# Patient Record
Sex: Female | Born: 1935 | Race: White | Hispanic: No | Smoking: Never smoker
Health system: Southern US, Community
[De-identification: ages and names within clinical notes are randomized; demographics above are authoritative.]

## PROBLEM LIST (undated history)

## (undated) DIAGNOSIS — Z950 Presence of cardiac pacemaker: Secondary | ICD-10-CM

## (undated) DIAGNOSIS — E7800 Pure hypercholesterolemia, unspecified: Secondary | ICD-10-CM

## (undated) DIAGNOSIS — I10 Essential (primary) hypertension: Secondary | ICD-10-CM

---

## 2013-11-25 ENCOUNTER — Encounter (HOSPITAL_COMMUNITY): Payer: Self-pay | Admitting: Emergency Medicine

## 2013-11-25 ENCOUNTER — Observation Stay (HOSPITAL_COMMUNITY): Payer: Medicare Other

## 2013-11-25 ENCOUNTER — Emergency Department (HOSPITAL_COMMUNITY): Payer: Medicare Other

## 2013-11-25 ENCOUNTER — Observation Stay (HOSPITAL_COMMUNITY)
Admission: EM | Admit: 2013-11-25 | Discharge: 2013-11-26 | Disposition: A | Discharge status: Home / Self Care | Payer: Medicare Other | Attending: Internal Medicine | Admitting: Internal Medicine

## 2013-11-25 DIAGNOSIS — G459 Transient cerebral ischemic attack, unspecified: Principal | ICD-10-CM | POA: Diagnosis present

## 2013-11-25 DIAGNOSIS — R404 Transient alteration of awareness: Secondary | ICD-10-CM | POA: Insufficient documentation

## 2013-11-25 DIAGNOSIS — R5383 Other fatigue: Secondary | ICD-10-CM

## 2013-11-25 DIAGNOSIS — F3289 Other specified depressive episodes: Secondary | ICD-10-CM

## 2013-11-25 DIAGNOSIS — E7800 Pure hypercholesterolemia, unspecified: Secondary | ICD-10-CM | POA: Diagnosis not present

## 2013-11-25 DIAGNOSIS — Z950 Presence of cardiac pacemaker: Secondary | ICD-10-CM | POA: Insufficient documentation

## 2013-11-25 DIAGNOSIS — I10 Essential (primary) hypertension: Secondary | ICD-10-CM | POA: Diagnosis present

## 2013-11-25 DIAGNOSIS — E785 Hyperlipidemia, unspecified: Secondary | ICD-10-CM

## 2013-11-25 DIAGNOSIS — F329 Major depressive disorder, single episode, unspecified: Secondary | ICD-10-CM

## 2013-11-25 DIAGNOSIS — R5381 Other malaise: Secondary | ICD-10-CM | POA: Insufficient documentation

## 2013-11-25 HISTORY — DX: Pure hypercholesterolemia, unspecified: E78.00

## 2013-11-25 HISTORY — DX: Presence of cardiac pacemaker: Z95.0

## 2013-11-25 HISTORY — DX: Essential (primary) hypertension: I10

## 2013-11-25 LAB — COMPREHENSIVE METABOLIC PANEL
ALT: 23 U/L (ref 0–35)
AST: 31 U/L (ref 0–37)
Albumin: 4.2 g/dL (ref 3.5–5.2)
Alkaline Phosphatase: 72 U/L (ref 39–117)
Anion gap: 15 (ref 5–15)
BUN: 18 mg/dL (ref 6–23)
CO2: 26 meq/L (ref 19–32)
CREATININE: 0.98 mg/dL (ref 0.50–1.10)
Calcium: 9.7 mg/dL (ref 8.4–10.5)
Chloride: 99 mEq/L (ref 96–112)
GFR calc Af Amer: 61 mL/min — ABNORMAL LOW (ref >90)
GFR, EST NON AFRICAN AMERICAN: 52 mL/min — AB (ref >90)
GLUCOSE: 123 mg/dL — AB (ref 70–99)
Potassium: 3.9 mEq/L (ref 3.7–5.3)
SODIUM: 140 meq/L (ref 137–147)
TOTAL PROTEIN: 7 g/dL (ref 6.0–8.3)
Total Bilirubin: 0.8 mg/dL (ref 0.3–1.2)

## 2013-11-25 LAB — URINALYSIS, ROUTINE W REFLEX MICROSCOPIC
Bilirubin Urine: NEGATIVE
Glucose, UA: NEGATIVE mg/dL
HGB URINE DIPSTICK: NEGATIVE
Ketones, ur: NEGATIVE mg/dL
Leukocytes, UA: NEGATIVE
NITRITE: NEGATIVE
PROTEIN: NEGATIVE mg/dL
SPECIFIC GRAVITY, URINE: 1.014 (ref 1.005–1.030)
UROBILINOGEN UA: 0.2 mg/dL (ref 0.0–1.0)
pH: 7.5 (ref 5.0–8.0)

## 2013-11-25 LAB — I-STAT TROPONIN, ED: TROPONIN I, POC: 0 ng/mL (ref 0.00–0.08)

## 2013-11-25 LAB — CBC
HEMATOCRIT: 36.8 % (ref 36.0–46.0)
Hemoglobin: 12.7 g/dL (ref 12.0–15.0)
MCH: 31.4 pg (ref 26.0–34.0)
MCHC: 34.5 g/dL (ref 30.0–36.0)
MCV: 91.1 fL (ref 78.0–100.0)
Platelets: 167 10*3/uL (ref 150–400)
RBC: 4.04 MIL/uL (ref 3.87–5.11)
RDW: 12.7 % (ref 11.5–15.5)
WBC: 4 10*3/uL (ref 4.0–10.5)

## 2013-11-25 LAB — RAPID URINE DRUG SCREEN, HOSP PERFORMED
AMPHETAMINES: NOT DETECTED
BARBITURATES: NOT DETECTED
BENZODIAZEPINES: NOT DETECTED
COCAINE: NOT DETECTED
Opiates: NOT DETECTED
Tetrahydrocannabinol: NOT DETECTED

## 2013-11-25 LAB — PROTIME-INR
INR: 1.02 (ref 0.00–1.49)
Prothrombin Time: 13.4 seconds (ref 11.6–15.2)

## 2013-11-25 LAB — ETHANOL: Alcohol, Ethyl (B): <11 mg/dL (ref 0–11)

## 2013-11-25 LAB — APTT: aPTT: 35 seconds (ref 24–37)

## 2013-11-25 LAB — CBG MONITORING, ED: Glucose-Capillary: 113 mg/dL — ABNORMAL HIGH (ref 70–99)

## 2013-11-25 IMAGING — CT CT ANGIO HEAD
1 of 9 series · 5 of 33 positions shown · IV contrast (Iohexol (Omnipaque 350))
Comparison: CT head 11/25/2013 .

CLINICAL DATA: TIA.  Confusion.  Hand weakness

EXAM:
CT ANGIOGRAPHY HEAD AND NECK
TECHNIQUE: Multidetector CT imaging of the head and neck was performed using
the standard protocol during bolus administration of intravenous
contrast. Multiplanar CT image reconstructions and MIPs were
obtained to evaluate the vascular anatomy. Carotid stenosis
measurements (when applicable) are obtained utilizing NASCET
criteria, using the distal internal carotid diameter as the
denominator.
CONTRAST:  50 mL Omnipaque 300 IV.

[Series 501: carotid, idose (1) · axial · 0.43mm/px · z∈[+805,+1021]mm · 5 of 162 slices shown]
[im 27/162  soft-tissue]
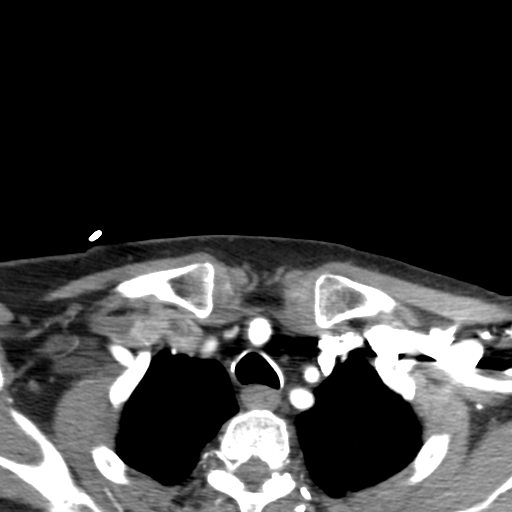
[im 54/162  bone]
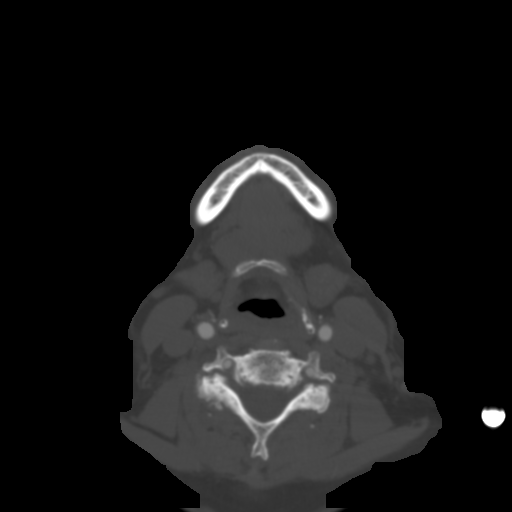
[im 81/162  soft-tissue]
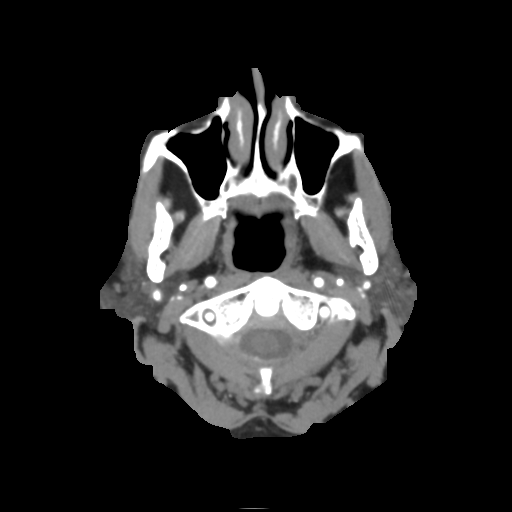
[im 108/162  bone]
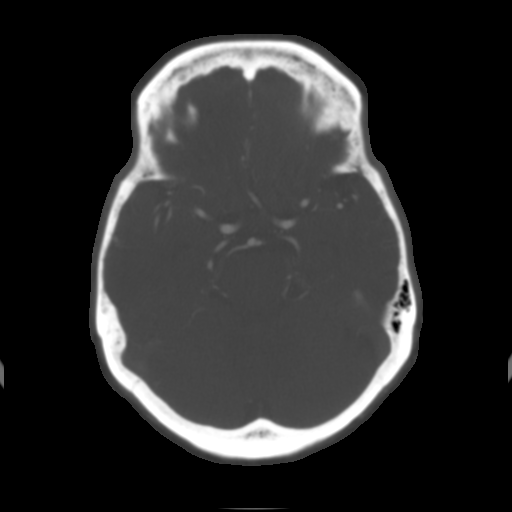
[im 135/162  soft-tissue]
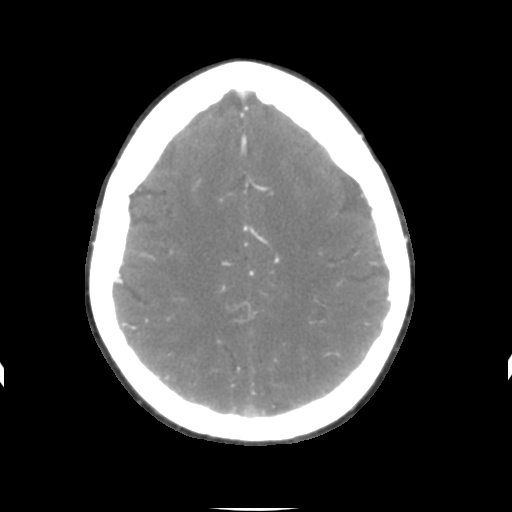

[5 of 33 positions shown; findings below may reference images not displayed]

FINDINGS: CTA HEAD FINDINGS

Cerebral volume normal for age. Negative for hydrocephalus. Mild
chronic microvascular ischemic change in the white matter. Negative
for acute infarct. Negative for hemorrhage or mass. Normal
enhancement following contrast administration. Atherosclerotic
calcification is noted in the vertebral artery and carotid arteries
bilaterally.

Both vertebral arteries are widely patent to the basilar. PICA
patent bilaterally. The basilar is widely patent. Superior
cerebellar arteries are patent. Mild stenosis of the right mid
posterior cerebral artery. Left posterior cerebral artery widely
patent

Atherosclerotic calcification throughout the cavernous carotid with
mild stenosis bilaterally. Anterior and middle cerebral arteries are
patent bilaterally. Moderate stenosis distal left M1 segment. Right
middle cerebral artery widely patent. Anterior cerebral arteries
widely patent

Negative for cerebral aneurysm.

Review of the MIP images confirms the above findings.

CTA NECK FINDINGS

Mild atherosclerotic disease in the aortic arch. Proximal great
vessels are widely patent.

Right carotid: Right common carotid artery widely patent. Mild
calcified plaque in the carotid bulb on the right without
significant stenosis. Negative for dissection.

Left carotid: Left common carotid artery widely patent. Minimal
atherosclerotic calcification in the carotid bulb without
significant stenosis

Both vertebral arteries are widely patent without stenosis or
dissection.

Review of the MIP images confirms the above findings.
IMPRESSION: No acute intracranial abnormality.

Mild stenosis right posterior cerebral artery.

Moderate stenosis distal left M1 segment.

No significant carotid or vertebral artery stenosis in the neck.

## 2013-11-25 MED ORDER — ESCITALOPRAM OXALATE 10 MG PO TABS
10.0000 mg | ORAL_TABLET | Freq: Every day | ORAL | Status: DC
  Filled 2013-11-25: qty 1

## 2013-11-25 MED ORDER — VITAMIN D3 25 MCG (1000 UNIT) PO TABS
1000.0000 [IU] | ORAL_TABLET | Freq: Every day | ORAL | Status: DC
  Administered 2013-11-26: 1000 [IU] via ORAL
  Filled 2013-11-25: qty 1

## 2013-11-25 MED ORDER — IOHEXOL 350 MG/ML SOLN
50.0000 mL | Freq: Once | INTRAVENOUS | Status: AC | PRN
  Administered 2013-11-25: 50 mL via INTRAVENOUS

## 2013-11-25 MED ORDER — LOSARTAN POTASSIUM 25 MG PO TABS
25.0000 mg | ORAL_TABLET | Freq: Every day | ORAL | Status: DC
  Administered 2013-11-26: 25 mg via ORAL
  Filled 2013-11-25: qty 1

## 2013-11-25 MED ORDER — SIMVASTATIN 40 MG PO TABS
40.0000 mg | ORAL_TABLET | Freq: Every day | ORAL | Status: DC
  Filled 2013-11-25: qty 1

## 2013-11-25 MED ORDER — ATENOLOL 12.5 MG HALF TABLET
12.5000 mg | ORAL_TABLET | Freq: Two times a day (BID) | ORAL | Status: DC
  Administered 2013-11-25 – 2013-11-26 (×2): 12.5 mg via ORAL
  Filled 2013-11-25 (×3): qty 1

## 2013-11-25 MED ORDER — ENOXAPARIN SODIUM 40 MG/0.4ML ~~LOC~~ SOLN
40.0000 mg | Freq: Every day | SUBCUTANEOUS | Status: DC
  Administered 2013-11-26: 40 mg via SUBCUTANEOUS
  Filled 2013-11-25: qty 0.4

## 2013-11-25 MED ORDER — STROKE: EARLY STAGES OF RECOVERY BOOK
Freq: Once | Status: AC
  Administered 2013-11-25
  Filled 2013-11-25: qty 1

## 2013-11-25 MED ORDER — L-METHYLFOLATE-B6-B12 3-35-2 MG PO TABS
1.0000 | ORAL_TABLET | Freq: Every day | ORAL | Status: DC
  Administered 2013-11-26: 1 via ORAL
  Filled 2013-11-25: qty 1

## 2013-11-25 MED ORDER — ASPIRIN 325 MG PO TABS
325.0000 mg | ORAL_TABLET | Freq: Every day | ORAL | Status: DC
  Administered 2013-11-26: 325 mg via ORAL
  Filled 2013-11-25: qty 1

## 2013-11-25 NOTE — Consult Note (Signed)
Referring Physician: Dr. Fonnie Jarvis    Chief Complaint: Transient weakness and clumsiness of right hand, as well as confusion.  HPI: Catherine Sullivan is an 80 y.o. female history of hypertension and hyperlipidemia who began experiencing altered mental status on 11/24/2013 which lasted several hours but resolved. At about 8 AM this morning she developed weakness involving right upper extremity and dropped several objects. She also was noted to be slightly confused. Symptoms lasted about 3 hours then resolved. She's been taking aspirin 162 mg per day. CT scan of her head showed no acute intracranial abnormality. NIH stroke score was 0 at the time of this evaluation.  LSN: 8 AM on 11/25/2013 tPA Given: No: Deficits resolved MRankin: 0  Past Medical History  Diagnosis Date  . Pacemaker   . Hypertension   . High cholesterol     History reviewed. No pertinent family history.   Medications: I have reviewed the patient's current medications.  ROS: History obtained from the patient  General ROS: negative for - chills, fatigue, fever, night sweats, weight gain or weight loss Psychological ROS: negative for - behavioral disorder, hallucinations, memory difficulties, mood swings or suicidal ideation Ophthalmic ROS: negative for - blurry vision, double vision, eye pain or loss of vision ENT ROS: negative for - epistaxis, nasal discharge, oral lesions, sore throat, tinnitus or vertigo Allergy and Immunology ROS: negative for - hives or itchy/watery eyes Hematological and Lymphatic ROS: negative for - bleeding problems, bruising or swollen lymph nodes Endocrine ROS: negative for - galactorrhea, hair pattern changes, polydipsia/polyuria or temperature intolerance Respiratory ROS: negative for - cough, hemoptysis, shortness of breath or wheezing Cardiovascular ROS: negative for - chest pain, dyspnea on exertion, edema or irregular heartbeat Gastrointestinal ROS: negative for - abdominal pain, diarrhea,  hematemesis, nausea/vomiting or stool incontinence Genito-Urinary ROS: negative for - dysuria, hematuria, incontinence or urinary frequency/urgency Musculoskeletal ROS: negative for - joint swelling or muscular weakness Neurological ROS: as noted in HPI Dermatological ROS: negative for rash and skin lesion changes  Physical Examination: Blood pressure 177/65, pulse 61, temperature 98.3 F (36.8 C), temperature source Oral, resp. rate 15, SpO2 97.00%.  Neurologic Examination: Mental Status: Alert, oriented, thought content appropriate.  Speech fluent without evidence of aphasia. Able to follow commands without difficulty. Cranial Nerves: II-Visual fields were normal. III/IV/VI-Pupils were equal and reacted. Extraocular movements were full and conjugate.    V/VII-no facial numbness and no facial weakness. VIII-normal. X-normal speech and symmetrical palatal movement. Motor: 5/5 bilaterally with normal tone and bulk Sensory: Normal throughout. Deep Tendon Reflexes: 2+ and symmetric. Plantars: Flexor bilaterally Cerebellar: Normal finger-to-nose testing.  Ct Head Wo Contrast  11/25/2013   CLINICAL DATA:  Confusion.  Right hand weakness.  EXAM: CT HEAD WITHOUT CONTRAST  TECHNIQUE: Contiguous axial images were obtained from the base of the skull through the vertex without intravenous contrast.  COMPARISON:  None.  FINDINGS: Mild global atrophy appropriate to age. Mild chronic ischemic changes in the periventricular white matter. No mass effect, midline shift, or acute intracranial hemorrhage.  IMPRESSION: No acute intracranial pathology.  Chronic changes are noted.   Electronically Signed   By: Maryclare Bean M.D.   On: 11/25/2013 16:03    Assessment: 80 y.o. female with a history of hypertension hyperlipidemia presenting with probable recurrent transient ischemic attacks. Small subcortical left cerebral infarction cannot be ruled out.  Stroke Risk Factors - hyperlipidemia and  hypertension  Plan: 1. HgbA1c, fasting lipid panel 2. MRI cannot be obtained as the patient's cardiac pacemaker 3.  PT consult, OT consult, Speech consult 4. Echocardiogram 5. CT angiogram of head and neck with contrast 6. Prophylactic therapy-Antiplatelet med: Aspirin 325 mg per day 7. Risk factor modification 8. Telemetry monitoring   C.R. Roseanne RenoStewart, MD [facility] [phone]  11/25/2013, 5:58 PM

## 2013-11-25 NOTE — ED Notes (Signed)
Meal tray ordered 

## 2013-11-25 NOTE — ED Notes (Signed)
Attempted report 

## 2013-11-25 NOTE — ED Provider Notes (Signed)
Medical screening examination/treatment/procedure(s) were conducted as a shared visit with non-physician practitioner(s) and myself.  I personally evaluated the patient during the encounter.   EKG Interpretation   Date/Time:  Friday November 25 2013 12:27:50 EDT Ventricular Rate:  67 PR Interval:  224 QRS Duration: 152 QT Interval:  476 QTC Calculation: 502 R Axis:   86 Text Interpretation:  Atrial-sensed ventricular-paced rhythm with  prolonged AV conduction No previous ECGs available Confirmed by [facility] [email or web address]   MD, Jonny RuizJOHN ([id number]) on 11/25/2013 4:33:07 PM     Patient back to baseline now last known well sometime last night; this morning had transient episode lasting at least a few hours of right hand weakness and or clumsiness with transient confusion yesterday evening and again today that has now resolved. Patient recalls having difficulty using her right hand intermittently today but does not recall feeling or seeming confused last night or earlier today. Patient's family states patient appears to be back to baseline now. 1630  Hurman HornJohn M Azizi Bally, MD 12/06/13 2102

## 2013-11-25 NOTE — H&P (Addendum)
[facility] History and Physical  Catherine Sullivan WGN:[id number] DOB: [birth year] DOA: 11/25/2013  Referring physician: Dr Fonnie Jarvis.  PCP: Pcp Not In System   Chief Complaint:   HPI: Catherine Sullivan is Sullivan 80 y.o. female wuth PMH significant for HTN, hyperlipidemia, she is is [place] visiting her son, she was brought to the ED today by son because of confusion and right hand weakness. He relates that she was confuse day prior to admission, not acting her self. Also she drops multiple objects today. Her symptoms has resolved. She was able to tell me that she was in [place], that she has 4 children, she born in [place]. She is here in [place] on vacation.  No fever, chest pain, no cough, no dysuria, vision changes.  No urinary of bowel incontinence.    Review of Systems:  Negative, except as per HPI.   Past Medical History  Diagnosis Date  . Pacemaker   . Hypertension   . High cholesterol    History reviewed. No pertinent past surgical history. Social History:  reports that she has never smoked. She does not have any smokeless tobacco history on file. She reports that she does not drink alcohol. Her drug history is not on file.  Allergies  Allergen Reactions  . Hydrocodone     Drowsiness   . Lyrica [Pregabalin]     Pt states she had multiple symptoms and had to be taken to the emergency room.  . Oxycodone     Drowsiness    Family History: history of stroke.   Prior to Admission medications   Medication Sig Start Date End Date Taking? Authorizing Provider  aspirin EC 81 MG tablet Take 162 mg by mouth at bedtime.   Yes Historical Provider, MD  atenolol (TENORMIN) 25 MG tablet Take 12.5 mg by mouth 2 (two) times daily.  09/09/13  Yes Historical Provider, MD  cholecalciferol (VITAMIN D) 1000 UNITS tablet Take 1,000 Units by mouth daily.   Yes Historical Provider, MD  escitalopram (LEXAPRO) 10 MG tablet Take 10 mg by mouth daily.   Yes Historical Provider, MD    ibuprofen (ADVIL,MOTRIN) 200 MG tablet Take 200 mg by mouth every 6 (six) hours as needed for mild pain.   Yes Historical Provider, MD  l-methylfolate-B6-B12 (METANX) 3-35-2 MG TABS Take 1 tablet by mouth daily.   Yes Historical Provider, MD  losartan (COZAAR) 25 MG tablet Take 25 mg by mouth daily.   Yes Historical Provider, MD  potassium chloride SA (K-DUR,KLOR-CON) 20 MEQ tablet Take 20 mEq by mouth 2 (two) times daily.  09/09/13  Yes Historical Provider, MD  simvastatin (ZOCOR) 40 MG tablet Take 40 mg by mouth daily at 6 PM.  09/09/13  Yes Historical Provider, MD   Physical Exam: Filed Vitals:   11/25/13 1700 11/25/13 1729 11/25/13 1730 11/25/13 1740  BP: 162/68 167/64 177/65   Pulse: 65  61   Temp:    98.4 F (36.9 C)  TempSrc:      Resp: 13 12 15    SpO2: 98% 99% 97%     Wt Readings from Last 3 Encounters:  No data found for Wt    General:  Appears calm and comfortable Eyes: PERRL, normal lids, irises & conjunctiva ENT: grossly normal hearing, lips & tongue Neck: no LAD, masses or thyromegaly Cardiovascular: RRR, no m/r/g. No LE edema. Telemetry: SR, no arrhythmias  Respiratory: CTA bilaterally, no w/r/r. Normal respiratory effort. Abdomen: soft, ntnd Skin: no rash or induration seen on  limited exam Musculoskeletal: grossly normal tone BUE/BLE Psychiatric: grossly normal mood and affect, speech fluent and appropriate Neurologic: grossly non-focal.          Labs on Admission:  Basic Metabolic Panel:  Recent Labs Lab 11/25/13 1230  NA 140  K 3.9  CL 99  CO2 26  GLUCOSE 123*  BUN 18  CREATININE 0.98  CALCIUM 9.7   Liver Function Tests:  Recent Labs Lab 11/25/13 1230  AST 31  ALT 23  ALKPHOS 72  BILITOT 0.8  PROT 7.0  ALBUMIN 4.2   No results found for this basename: LIPASE, AMYLASE,  in the last 168 hours No results found for this basename: AMMONIA,  in the last 168 hours CBC:  Recent Labs Lab 11/25/13 1230  WBC 4.0  HGB 12.7  HCT 36.8  MCV  91.1  PLT 167   Cardiac Enzymes: No results found for this basename: CKTOTAL, CKMB, CKMBINDEX, TROPONINI,  in the last 168 hours  BNP (last 3 results) No results found for this basename: PROBNP,  in the last 8760 hours CBG:  Recent Labs Lab 11/25/13 1509  GLUCAP 113*    Radiological Exams on Admission: Ct Head Wo Contrast  11/25/2013   CLINICAL DATA:  Confusion.  Right hand weakness.  EXAM: CT HEAD WITHOUT CONTRAST  TECHNIQUE: Contiguous axial images were obtained from the base of the skull through the vertex without intravenous contrast.  COMPARISON:  None.  FINDINGS: Mild global atrophy appropriate to age. Mild chronic ischemic changes in the periventricular white matter. No mass effect, midline shift, or acute intracranial hemorrhage.  IMPRESSION: No acute intracranial pathology.  Chronic changes are noted.   Electronically Signed   By: Maryclare BeanArt  Hoss M.D.   On: 11/25/2013 16:03    EKG: Independently reviewed. Paced rhythm.   Assessment/Plan Active Problems:   TIA (transient ischemic attack)   1-TIA; patient presents with confusion, right hand grip weakness. symptoms has resolved. This could be related to TIA. Admit to telemetry, Stroke work up, carotid doppler, ECHO, HBA1c, lipid panel. Aspirin for stroke prevention.   2-HTN: continue with cozaar and atenolol.   3-Hyperlipidemia; Continue with Zocor.   4-Depression; Continue with Lexapro.   5-S/P pacemaker; she relate that her heart stop secondary to medication, Lyrica.   Code Status: presume full code.  DVT Prophylaxis:lovenox.  Family Communication: care discussed with son.  Disposition Plan: expect less than 2 nights.   Time spent: 75 minutes.   Catherine Sullivan, Catherine Sullivan [facility] Pager [phone]  **Disclaimer: This note may have been dictated with voice recognition software. Similar sounding words can inadvertently be transcribed and this note may contain transcription errors which may not have been corrected upon  publication of note.**

## 2013-11-25 NOTE — ED Provider Notes (Signed)
CSN: [id number]     Arrival date & time 11/25/13  1206 History   First MD Initiated Contact with Patient 11/25/13 1513     Chief Complaint  Patient presents with  . Altered Mental Status     (Consider location/radiation/quality/duration/timing/severity/associated sxs/prior Treatment) HPI Catherine Sullivan is a 80 y.o. female who presents emergency department with altered mental status. Patient has history of heart arrhythmia with a pacemaker, hypertension, high cholesterol, no other medical problems. She normally lives alone at home, but has been staying with her son for the last week. According to her son, the patient started to show more confusion onset yesterday. She states she mixed up some days and did not act like herself yesterday. He states today she dropped 3 glasses, as well as a spoon while eating her cereal. Patient's son states that the symptoms come and go. Patient denies any symptoms at this time. She states she does remember that she could not hold anything with her right hand earlier today. Patient denies any prior history of stroke. She denies any headache or head injuries. She denies any prior or current difficulty with speech or walking. She denies any current complaints at this time. She denies any recent illnesses, fever, urinary symptoms.  Past Medical History  Diagnosis Date  . Pacemaker   . Hypertension   . High cholesterol    History reviewed. No pertinent past surgical history. History reviewed. No pertinent family history. History  Substance Use Topics  . Smoking status: Never Smoker   . Smokeless tobacco: Not on file  . Alcohol Use: No   OB History   Grav Para Term Preterm Abortions TAB SAB Ect Mult Living                 Review of Systems  Constitutional: Negative for fever and chills.  Respiratory: Negative for cough, chest tightness and shortness of breath.   Cardiovascular: Negative for chest pain, palpitations and leg swelling.  Gastrointestinal:  Negative for nausea, vomiting, abdominal pain and diarrhea.  Genitourinary: Negative for dysuria, flank pain and pelvic pain.  Musculoskeletal: Negative for arthralgias, myalgias, neck pain and neck stiffness.  Skin: Negative for rash.  Neurological: Positive for weakness. Negative for dizziness, tremors, facial asymmetry, speech difficulty, numbness and headaches.  All other systems reviewed and are negative.     Allergies  Hydrocodone; Lyrica; and Oxycodone  Home Medications   Prior to Admission medications   Not on File   BP 186/72  Pulse 65  Temp(Src) 98.3 F (36.8 C) (Oral)  Resp 14  SpO2 99% Physical Exam  Nursing note and vitals reviewed. Constitutional: She is oriented to person, place, and time. She appears well-developed and well-nourished. No distress.  HENT:  Head: Normocephalic.  Eyes: Conjunctivae and EOM are normal. Pupils are equal, round, and reactive to light.  Neck: Neck supple.  Cardiovascular: Normal rate, regular rhythm and normal heart sounds.   Pulmonary/Chest: Effort normal and breath sounds normal. No respiratory distress. She has no wheezes. She has no rales.  Abdominal: Soft. Bowel sounds are normal. She exhibits no distension. There is no tenderness. There is no rebound.  Musculoskeletal: She exhibits no edema.  Neurological: She is alert and oriented to person, place, and time. No cranial nerve deficit. Coordination normal.  5/5 and equal upper and lower extremity strength bilaterally. Equal grip strength bilaterally. Normal finger to nose and heel to shin. No pronator drift.   Skin: Skin is warm and dry.  Psychiatric: She has a  normal mood and affect. Her behavior is normal.    ED Course  Procedures (including critical care time) Labs Review Labs Reviewed  COMPREHENSIVE METABOLIC PANEL - Abnormal; Notable for the following:    Glucose, Bld 123 (*)    GFR calc non Af Amer 52 (*)    GFR calc Af Amer 61 (*)    All other components within  normal limits  CBG MONITORING, ED - Abnormal; Notable for the following:    Glucose-Capillary 113 (*)    All other components within normal limits  CBC  URINALYSIS, ROUTINE W REFLEX MICROSCOPIC  ETHANOL  PROTIME-INR  APTT  URINE RAPID DRUG SCREEN (HOSP PERFORMED)  HEMOGLOBIN A1C  LIPID PANEL  I-STAT TROPOININ, ED    Imaging Review Ct Angio Head W/cm &/or Wo Cm  11/25/2013   CLINICAL DATA:  TIA.  Confusion.  Hand weakness  EXAM: CT ANGIOGRAPHY HEAD AND NECK  TECHNIQUE: Multidetector CT imaging of the head and neck was performed using the standard protocol during bolus administration of intravenous contrast. Multiplanar CT image reconstructions and MIPs were obtained to evaluate the vascular anatomy. Carotid stenosis measurements (when applicable) are obtained utilizing NASCET criteria, using the distal internal carotid diameter as the denominator.  CONTRAST:  50 mL Omnipaque 300 IV.  COMPARISON:  CT head 11/25/2013 .  FINDINGS: CTA HEAD FINDINGS  Cerebral volume normal for age. Negative for hydrocephalus. Mild chronic microvascular ischemic change in the white matter. Negative for acute infarct. Negative for hemorrhage or mass. Normal enhancement following contrast administration. Atherosclerotic calcification is noted in the vertebral artery and carotid arteries bilaterally.  Both vertebral arteries are widely patent to the basilar. PICA patent bilaterally. The basilar is widely patent. Superior cerebellar arteries are patent. Mild stenosis of the right mid posterior cerebral artery. Left posterior cerebral artery widely patent  Atherosclerotic calcification throughout the cavernous carotid with mild stenosis bilaterally. Anterior and middle cerebral arteries are patent bilaterally. Moderate stenosis distal left M1 segment. Right middle cerebral artery widely patent. Anterior cerebral arteries widely patent  Negative for cerebral aneurysm.  Review of the MIP images confirms the above findings.  CTA  NECK FINDINGS  Mild atherosclerotic disease in the aortic arch. Proximal great vessels are widely patent.  Right carotid: Right common carotid artery widely patent. Mild calcified plaque in the carotid bulb on the right without significant stenosis. Negative for dissection.  Left carotid: Left common carotid artery widely patent. Minimal atherosclerotic calcification in the carotid bulb without significant stenosis  Both vertebral arteries are widely patent without stenosis or dissection.  Review of the MIP images confirms the above findings.  IMPRESSION: No acute intracranial abnormality.  Mild stenosis right posterior cerebral artery.  Moderate stenosis distal left M1 segment.  No significant carotid or vertebral artery stenosis in the neck.   Electronically Signed   By: Marlan Palauharles  Clark M.D.   On: 11/25/2013 20:29   Ct Head Wo Contrast  11/25/2013   CLINICAL DATA:  Confusion.  Right hand weakness.  EXAM: CT HEAD WITHOUT CONTRAST  TECHNIQUE: Contiguous axial images were obtained from the base of the skull through the vertex without intravenous contrast.  COMPARISON:  None.  FINDINGS: Mild global atrophy appropriate to age. Mild chronic ischemic changes in the periventricular white matter. No mass effect, midline shift, or acute intracranial hemorrhage.  IMPRESSION: No acute intracranial pathology.  Chronic changes are noted.   Electronically Signed   By: Maryclare BeanArt  Hoss M.D.   On: 11/25/2013 16:03   Ct Angio Neck  W/cm &/or Wo/cm  11/25/2013   CLINICAL DATA:  TIA.  Confusion.  Hand weakness  EXAM: CT ANGIOGRAPHY HEAD AND NECK  TECHNIQUE: Multidetector CT imaging of the head and neck was performed using the standard protocol during bolus administration of intravenous contrast. Multiplanar CT image reconstructions and MIPs were obtained to evaluate the vascular anatomy. Carotid stenosis measurements (when applicable) are obtained utilizing NASCET criteria, using the distal internal carotid diameter as the denominator.   CONTRAST:  50 mL Omnipaque 300 IV.  COMPARISON:  CT head 11/25/2013 .  FINDINGS: CTA HEAD FINDINGS  Cerebral volume normal for age. Negative for hydrocephalus. Mild chronic microvascular ischemic change in the white matter. Negative for acute infarct. Negative for hemorrhage or mass. Normal enhancement following contrast administration. Atherosclerotic calcification is noted in the vertebral artery and carotid arteries bilaterally.  Both vertebral arteries are widely patent to the basilar. PICA patent bilaterally. The basilar is widely patent. Superior cerebellar arteries are patent. Mild stenosis of the right mid posterior cerebral artery. Left posterior cerebral artery widely patent  Atherosclerotic calcification throughout the cavernous carotid with mild stenosis bilaterally. Anterior and middle cerebral arteries are patent bilaterally. Moderate stenosis distal left M1 segment. Right middle cerebral artery widely patent. Anterior cerebral arteries widely patent  Negative for cerebral aneurysm.  Review of the MIP images confirms the above findings.  CTA NECK FINDINGS  Mild atherosclerotic disease in the aortic arch. Proximal great vessels are widely patent.  Right carotid: Right common carotid artery widely patent. Mild calcified plaque in the carotid bulb on the right without significant stenosis. Negative for dissection.  Left carotid: Left common carotid artery widely patent. Minimal atherosclerotic calcification in the carotid bulb without significant stenosis  Both vertebral arteries are widely patent without stenosis or dissection.  Review of the MIP images confirms the above findings.  IMPRESSION: No acute intracranial abnormality.  Mild stenosis right posterior cerebral artery.  Moderate stenosis distal left M1 segment.  No significant carotid or vertebral artery stenosis in the neck.   Electronically Signed   By: Marlan Palau M.D.   On: 11/25/2013 20:29     EKG Interpretation   Date/Time:  Friday  November 25 2013 12:27:50 EDT Ventricular Rate:  67 PR Interval:  224 QRS Duration: 152 QT Interval:  476 QTC Calculation: 502 R Axis:   86 Text Interpretation:  Atrial-sensed ventricular-paced rhythm with  prolonged AV conduction No previous ECGs available Confirmed by [facility]   MD, Jonny Ruiz ([id number]) on 11/25/2013 4:33:07 PM      MDM   Final diagnoses:  Transient cerebral ischemia, unspecified transient cerebral ischemia type    3:27 PM  Patient seen and examined. Currently normal neurological exam, she has no complaints. She is hypertensive, otherwise normal vital signs. Her symptoms are concerning for possible TIA, versus a CVA. Onset of symptoms yesterday. Will get labs, which have already been obtained by triage, will add CT and urinalysis. Patient will be monitored.  5:10 PM CT negative. Discussed with Dr. Fonnie Jarvis who has seen pt. Agrees concerning for TIAs. Will need admission for further work up. Discussed with Dr. Roseanne Reno who will consult. Medicine paged.   Spoke with [facility], will admit.   Filed Vitals:   11/25/13 1830 11/25/13 1858 11/25/13 1900 11/25/13 2028  BP: 180/92 180/92 166/64 166/59  Pulse: 68  61 68  Temp:    98.4 F (36.9 C)  TempSrc:    Oral  Resp: 15 16 13 18   Height:    5' 1.5" (1.562  m)  Weight:    127 lb 12.8 oz (57.97 kg)  SpO2: 97% 98% 97% 98%       Lottie Mussel, PA-C 11/26/13 [phone]

## 2013-11-25 NOTE — ED Notes (Signed)
Service response called regarding the delay in pt receiving tray. States that they will call the kitchen

## 2013-11-25 NOTE — ED Notes (Signed)
CBG: 113 °

## 2013-11-25 NOTE — ED Notes (Addendum)
Per pt family and pt she had some confusion that started yesterday. Getting mixed up with the days and other things. Today pt was eating her cereal and lost control of right hand. Pt sts this happened multiple times today. Pt has equal strong grips, clear speech and no facial drop. No weakness. Pt A&O at triage

## 2013-11-26 DIAGNOSIS — I10 Essential (primary) hypertension: Secondary | ICD-10-CM

## 2013-11-26 DIAGNOSIS — G459 Transient cerebral ischemic attack, unspecified: Secondary | ICD-10-CM

## 2013-11-26 LAB — HEMOGLOBIN A1C
Hgb A1c MFr Bld: 6.1 % — ABNORMAL HIGH (ref <5.7)
Mean Plasma Glucose: 128 mg/dL — ABNORMAL HIGH (ref <117)

## 2013-11-26 LAB — LIPID PANEL
CHOLESTEROL: 182 mg/dL (ref 0–200)
HDL: 105 mg/dL (ref >39)
LDL CALC: 57 mg/dL (ref 0–99)
Total CHOL/HDL Ratio: 1.7 RATIO
Triglycerides: 99 mg/dL (ref <150)
VLDL: 20 mg/dL (ref 0–40)

## 2013-11-26 MED ORDER — ASPIRIN 325 MG PO TABS: 325.0000 mg | ORAL_TABLET | Freq: Every day | ORAL | Status: AC

## 2013-11-26 NOTE — Discharge Summary (Signed)
Physician Discharge Summary  Catherine Sullivan ZOX:[id number] DOB: 1928/11/11 DOA: 11/25/2013  Catherine: Catherine Sullivan  Admit date: 11/25/2013 Discharge date: 11/26/2013  Time spent: >35 minutes  Recommendations for Outpatient Follow-up:  Please follow up with primary care doctor next week to obtain echocardiogram Please follow up with cardiologist on Monday for pacemaker interrogation   Discharge Diagnoses:  Active Problems:   TIA (transient ischemic attack)   Hypertension   Discharge Condition: stable   Diet recommendation: low sodium   Filed Weights   11/25/13 2028 11/26/13 0400  Weight: 57.97 kg (127 lb 12.8 oz) 57.9 kg (127 lb 10.3 oz)    History of present illness:  80 y.o. female wuth PMH significant for HTN, hyperlipidemia, she is is Catherine Sullivan visiting her son, she was brought to the ED today by son because of confusion and right hand weakness. He relates that she was confuse day prior to admission, not acting her self.   Hospital Course:  1. Suspected TIA; patient presents with confusion, right hand grip weakness.  -symptoms has resolved. CT and CTA head and neck negative -increased ASA to 325; d/w patient and her son  And recommended to obtain echo, pacemaker check and complete TIA work up; But they wanted to go home obtain these test as outpatient on monday;  2. HTN: continue with cozaar and atenolol.  3. Hyperlipidemia; Continue with Zocor.  4. Depression; Continue with Lexapro.  5. S/P pacemaker; ECG paced rhythm; patient wants pacemaker check on Monday, says that she already has appointment on Monday    Procedures:  none (i.e. Studies not automatically included, echos, thoracentesis, etc; not x-rays)  Consultations:  Neurology   Discharge Exam: Filed Vitals:   11/26/13 0828  BP: 164/66  Pulse: 62  Temp: 98.2 F (36.8 C)  Resp: 18    General: alert Cardiovascular: s1,s2 rrr Respiratory: CTA BL  Discharge Instructions  Discharge Instructions   Diet - low sodium heart healthy    Complete by:  As directed      Discharge instructions    Complete by:  As directed   Please follow up with primary care doctor next week to obtain echocardiogram Please follow up with cardiologist on Monday for pacemaker interrogation     Increase activity slowly    Complete by:  As directed             Medication List    STOP taking these medications       aspirin EC 81 MG tablet  Replaced by:  aspirin 325 MG tablet     ibuprofen 200 MG tablet  Commonly known as:  ADVIL,MOTRIN      TAKE these medications       aspirin 325 MG tablet  Take 1 tablet (325 mg total) by mouth daily.     atenolol 25 MG tablet  Commonly known as:  TENORMIN  Take 12.5 mg by mouth 2 (two) times daily.     cholecalciferol 1000 UNITS tablet  Commonly known as:  VITAMIN D  Take 1,000 Units by mouth daily.     escitalopram 10 MG tablet  Commonly known as:  LEXAPRO  Take 10 mg by mouth daily.     l-methylfolate-B6-B12 3-35-2 MG Tabs  Commonly known as:  METANX  Take 1 tablet by mouth daily.     losartan 25 MG tablet  Commonly known as:  COZAAR  Take 25 mg by mouth daily.     potassium chloride SA 20 MEQ tablet  Commonly  known as:  K-DUR,KLOR-CON  Take 20 mEq by mouth 2 (two) times daily.     simvastatin 40 MG tablet  Commonly known as:  ZOCOR  Take 40 mg by mouth daily at 6 PM.       Allergies  Allergen Reactions  . Hydrocodone     Drowsiness   . Lyrica [Pregabalin]     Pt states she had multiple symptoms and had to be taken to the emergency room.  . Oxycodone     Drowsiness       Follow-up Information   Follow up with Catherine Sullivan.       The results of significant diagnostics from this hospitalization (including imaging, microbiology, ancillary and laboratory) are listed below for reference.    Significant Diagnostic Studies: Ct Angio Head W/cm &/or Wo Cm  11/25/2013   CLINICAL DATA:  TIA.  Confusion.  Hand weakness  EXAM: CT  ANGIOGRAPHY HEAD AND NECK  TECHNIQUE: Multidetector CT imaging of the head and neck was performed using the standard protocol during bolus administration of intravenous contrast. Multiplanar CT image reconstructions and MIPs were obtained to evaluate the vascular anatomy. Carotid stenosis measurements (when applicable) are obtained utilizing NASCET criteria, using the distal internal carotid diameter as the denominator.  CONTRAST:  50 mL Omnipaque 300 IV.  COMPARISON:  CT head 11/25/2013 .  FINDINGS: CTA HEAD FINDINGS  Cerebral volume normal for age. Negative for hydrocephalus. Mild chronic microvascular ischemic change in the white matter. Negative for acute infarct. Negative for hemorrhage or mass. Normal enhancement following contrast administration. Atherosclerotic calcification is noted in the vertebral artery and carotid arteries bilaterally.  Both vertebral arteries are widely patent to the basilar. PICA patent bilaterally. The basilar is widely patent. Superior cerebellar arteries are patent. Mild stenosis of the right mid posterior cerebral artery. Left posterior cerebral artery widely patent  Atherosclerotic calcification throughout the cavernous carotid with mild stenosis bilaterally. Anterior and middle cerebral arteries are patent bilaterally. Moderate stenosis distal left M1 segment. Right middle cerebral artery widely patent. Anterior cerebral arteries widely patent  Negative for cerebral aneurysm.  Review of the MIP images confirms the above findings.  CTA NECK FINDINGS  Mild atherosclerotic disease in the aortic arch. Proximal great vessels are widely patent.  Right carotid: Right common carotid artery widely patent. Mild calcified plaque in the carotid bulb on the right without significant stenosis. Negative for dissection.  Left carotid: Left common carotid artery widely patent. Minimal atherosclerotic calcification in the carotid bulb without significant stenosis  Both vertebral arteries are  widely patent without stenosis or dissection.  Review of the MIP images confirms the above findings.  IMPRESSION: No acute intracranial abnormality.  Mild stenosis right posterior cerebral artery.  Moderate stenosis distal left M1 segment.  No significant carotid or vertebral artery stenosis in the neck.   Electronically Signed   By: Marlan Palauharles  Clark M.D.   On: 11/25/2013 20:29   Ct Head Wo Contrast  11/25/2013   CLINICAL DATA:  Confusion.  Right hand weakness.  EXAM: CT HEAD WITHOUT CONTRAST  TECHNIQUE: Contiguous axial images were obtained from the base of the skull through the vertex without intravenous contrast.  COMPARISON:  None.  FINDINGS: Mild global atrophy appropriate to age. Mild chronic ischemic changes in the periventricular white matter. No mass effect, midline shift, or acute intracranial hemorrhage.  IMPRESSION: No acute intracranial pathology.  Chronic changes are noted.   Electronically Signed   By: Maryclare BeanArt  Hoss M.D.   On:  11/25/2013 16:03   Ct Angio Neck W/cm &/or Wo/cm  11/25/2013   CLINICAL DATA:  TIA.  Confusion.  Hand weakness  EXAM: CT ANGIOGRAPHY HEAD AND NECK  TECHNIQUE: Multidetector CT imaging of the head and neck was performed using the standard protocol during bolus administration of intravenous contrast. Multiplanar CT image reconstructions and MIPs were obtained to evaluate the vascular anatomy. Carotid stenosis measurements (when applicable) are obtained utilizing NASCET criteria, using the distal internal carotid diameter as the denominator.  CONTRAST:  50 mL Omnipaque 300 IV.  COMPARISON:  CT head 11/25/2013 .  FINDINGS: CTA HEAD FINDINGS  Cerebral volume normal for age. Negative for hydrocephalus. Mild chronic microvascular ischemic change in the white matter. Negative for acute infarct. Negative for hemorrhage or mass. Normal enhancement following contrast administration. Atherosclerotic calcification is noted in the vertebral artery and carotid arteries bilaterally.  Both  vertebral arteries are widely patent to the basilar. PICA patent bilaterally. The basilar is widely patent. Superior cerebellar arteries are patent. Mild stenosis of the right mid posterior cerebral artery. Left posterior cerebral artery widely patent  Atherosclerotic calcification throughout the cavernous carotid with mild stenosis bilaterally. Anterior and middle cerebral arteries are patent bilaterally. Moderate stenosis distal left M1 segment. Right middle cerebral artery widely patent. Anterior cerebral arteries widely patent  Negative for cerebral aneurysm.  Review of the MIP images confirms the above findings.  CTA NECK FINDINGS  Mild atherosclerotic disease in the aortic arch. Proximal great vessels are widely patent.  Right carotid: Right common carotid artery widely patent. Mild calcified plaque in the carotid bulb on the right without significant stenosis. Negative for dissection.  Left carotid: Left common carotid artery widely patent. Minimal atherosclerotic calcification in the carotid bulb without significant stenosis  Both vertebral arteries are widely patent without stenosis or dissection.  Review of the MIP images confirms the above findings.  IMPRESSION: No acute intracranial abnormality.  Mild stenosis right posterior cerebral artery.  Moderate stenosis distal left M1 segment.  No significant carotid or vertebral artery stenosis in the neck.   Electronically Signed   By: Marlan Palau M.D.   On: 11/25/2013 20:29    Microbiology: No results found for this or any previous visit (from the past 240 hour(s)).   Labs: Basic Metabolic Panel:  Recent Labs Lab 11/25/13 1230  NA 140  K 3.9  CL 99  CO2 26  GLUCOSE 123*  BUN 18  CREATININE 0.98  CALCIUM 9.7   Liver Function Tests:  Recent Labs Lab 11/25/13 1230  AST 31  ALT 23  ALKPHOS 72  BILITOT 0.8  PROT 7.0  ALBUMIN 4.2   No results found for this basename: LIPASE, AMYLASE,  in the last 168 hours No results found for  this basename: AMMONIA,  in the last 168 hours CBC:  Recent Labs Lab 11/25/13 1230  WBC 4.0  HGB 12.7  HCT 36.8  MCV 91.1  PLT 167   Cardiac Enzymes: No results found for this basename: CKTOTAL, CKMB, CKMBINDEX, TROPONINI,  in the last 168 hours BNP: BNP (last 3 results) No results found for this basename: PROBNP,  in the last 8760 hours CBG:  Recent Labs Lab 11/25/13 1509  GLUCAP 113*       Signed:  Jonette Mate N  [facility] 11/26/2013, 3:59 PM

## 2013-11-26 NOTE — Progress Notes (Signed)
STROKE TEAM PROGRESS NOTE   HISTORY Catherine Sullivan is an 80 y.o. female history of hypertension and hyperlipidemia who began experiencing altered mental status on 11/24/2013 which lasted several hours but resolved. At about 8 AM this morning she developed weakness involving right upper extremity and dropped several objects. She also was noted to be slightly confused. Symptoms lasted about 3 hours then resolved. She's been taking aspirin 162 mg per day. CT scan of her head showed no acute intracranial abnormality. NIH stroke score was 0 at the time of this evaluation.  SUBJECTIVE (INTERVAL HISTORY) No family is at the bedside.  Overall she feels her condition is resolved. She stated that she dropped things yesterday because the two objects were too heavy for her. She denies any AMS or confusion yesterday. She is on ASA 162mg  daily. She has pacemaker put in due to hx of cardiac arrest for 4 min. Pacemaker has not been interrogated yet.  OBJECTIVE  Recent Labs Lab 11/25/13 1509  GLUCAP 113*    Recent Labs Lab 11/25/13 1230  NA 140  K 3.9  CL 99  CO2 26  GLUCOSE 123*  BUN 18  CREATININE 0.98  CALCIUM 9.7    Recent Labs Lab 11/25/13 1230  AST 31  ALT 23  ALKPHOS 72  BILITOT 0.8  PROT 7.0  ALBUMIN 4.2    Recent Labs Lab 11/25/13 1230  WBC 4.0  HGB 12.7  HCT 36.8  MCV 91.1  PLT 167   No results found for this basename: CKTOTAL, CKMB, CKMBINDEX, TROPONINI,  in the last 168 hours  Recent Labs  11/25/13 1700  LABPROT 13.4  INR 1.02    Recent Labs  11/25/13 1503  COLORURINE YELLOW  LABSPEC 1.014  PHURINE 7.5  GLUCOSEU NEGATIVE  HGBUR NEGATIVE  BILIRUBINUR NEGATIVE  KETONESUR NEGATIVE  PROTEINUR NEGATIVE  UROBILINOGEN 0.2  NITRITE NEGATIVE  LEUKOCYTESUR NEGATIVE       Component Value Date/Time   CHOL 182 11/26/2013 0559   TRIG 99 11/26/2013 0559   HDL 105 11/26/2013 0559   CHOLHDL 1.7 11/26/2013 0559   VLDL 20 11/26/2013 0559   LDLCALC 57 11/26/2013 0559    Lab Results  Component Value Date   HGBA1C 6.1* 11/25/2013      Component Value Date/Time   LABOPIA NONE DETECTED 11/25/2013 1503   COCAINSCRNUR NONE DETECTED 11/25/2013 1503   LABBENZ NONE DETECTED 11/25/2013 1503   AMPHETMU NONE DETECTED 11/25/2013 1503   THCU NONE DETECTED 11/25/2013 1503   LABBARB NONE DETECTED 11/25/2013 1503     Recent Labs Lab 11/25/13 1640  ETH <11    Ct Angio Head W/cm &/or Wo Cm  11/25/2013   IMPRESSION: No acute intracranial abnormality.  Mild stenosis right posterior cerebral artery.  Moderate stenosis distal left M1 segment.  No significant carotid or vertebral artery stenosis in the neck.     Ct Head Wo Contrast  11/25/2013    IMPRESSION: No acute intracranial pathology.  Chronic changes are noted.     2D echo - pending  PHYSICAL EXAM Physical exam  Temp:  [98 F (36.7 C)-98.4 F (36.9 C)] 98.2 F (36.8 C) (08/22 0828) Pulse Rate:  [61-68] 62 (08/22 0828) Resp:  [12-18] 18 (08/22 0828) BP: (138-186)/(55-92) 164/66 mmHg (08/22 0828) SpO2:  [95 %-99 %] 97 % (08/22 0828) Weight:  [127 lb 10.3 oz (57.9 kg)-127 lb 12.8 oz (57.97 kg)] 127 lb 10.3 oz (57.9 kg) (08/22 0400)  General - Well nourished, well developed, in no apparent  distress.  Ophthalmologic - Sharp disc margins OU.  Cardiovascular - Regular rate and rhythm with no murmur.  Mental Status -  Level of arousal and orientation to time, place, and person were intact. Language including expression, naming, repetition, comprehension, reading, and writing was assessed and found intact. Attention span and concentration were normal. Recent and remote memory were intact. Fund of Knowledge was assessed and was intact.  Cranial Nerves II - XII - II - Vision intact OU. III, IV, VI - Extraocular movements intact. V - Facial sensation intact bilaterally. VII - Facial movement intact bilaterally. VIII - Hearing & vestibular intact bilaterally. X - Palate elevates symmetrically. XI - Chin  turning & shoulder shrug intact bilaterally. XII - Tongue protrusion intact.  Motor Strength - The patient's strength was normal in all extremities and pronator drift was absent.  Bulk was normal and fasciculations were absent.   Motor Tone - Muscle tone was assessed at the neck and appendages and was normal.  Reflexes - The patient's reflexes were normal in all extremities and she had no pathological reflexes.  Sensory - Light touch, temperature/pinprick, vibration and proprioception, and Romberg testing were assessed and were normal.    Coordination - The patient had normal movements in the hands and feet with no ataxia or dysmetria.  Tremor was absent.  Gait and Station - The patient's transfers, posture, gait, station, and turns were observed as normal.   ASSESSMENT/PLAN  Ms. Catherine Sullivan is a 80 y.o. female with hx of HTN, HLD and pacemaker in place presenting with right UE weakness with dropping things. Pt stated that she dropped things because the objects are too heavy for her. She denies AMS which son claimed. She does have stroke risk factors and on ASA 162 at home. Will recommend either ASA 325mg  or plavix 75mg  daily. CT and CTA negative. Will request to have pacemaker interrogated. If afib found, the she needs anticoagulation.  Likely TIA - secondary to stroke risk factors    aspirin 162 mg orally every day prior to admission, now on ASA 325mg   Recommend either ASA 325mg  or plavix 75mg  daily  CT and CTA head and neck negative  2D Echo pending   LDL 57, no statin necessary as meeting goal of LDL <100  HgbA1c 6.1 WNL  lovenox subq for VTE prophylaxis  Carb diet   Activity as tolerated  Therapy needs:  pending  Risk factor management/education  Patient counseled to be compliant with his antithrombotic medications  Disposition:  Home with supervision  Hypertension   Home meds:  Atenolol and losartan. Resumed in hospital  SBP goal 130/80  Stable  Patient  counseled to be compliant with his blood pressure medications  Hyperlipidemia  LDL 57   Patient on zocor at home, continued in hospital (formulary)  LDL goal < 100 (<70 for diabetics)  Diabetes  HgbA1c 6.1   Controlled  Goal < 7.0  educate patient about lifestyle changes for diabetes prevention  Other Stroke Risk Factors Advanced age Pacemaker placed - recommend to interrogate pacemaker while in hospital. If afib found, she needs anticoagulation.  Hospital day # 1  Marvel Plan, MD PhD Stroke Neurology 11/26/2013 3:13 PM   To contact Stroke Continuity provider, please refer to [email or web address]. After hours, contact General Neurology

## 2013-11-26 NOTE — Progress Notes (Signed)
Per telephone order York Spaniel([place]), DC'd carotid doppler since patient has had CTA. Will cont to monitor

## 2013-11-26 NOTE — Progress Notes (Signed)
Per son, patient does not want pork or beef for her meals. Service [employer] called and this message was relayed to them.

## 2013-11-26 NOTE — Discharge Instructions (Signed)
Please follow up with primary care doctor next week to obtain echocardiogram Please follow up with cardiologist on Monday for pacemaker interrogation

## 2014-04-12 ENCOUNTER — Encounter: Payer: Self-pay | Admitting: Endocrinology

## 2014-04-12 ENCOUNTER — Ambulatory Visit (INDEPENDENT_AMBULATORY_CARE_PROVIDER_SITE_OTHER): Payer: Medicare Other | Admitting: Endocrinology

## 2014-04-12 VITALS — BP 130/84 | HR 98 | Temp 97.7°F | Ht 61.5 in | Wt 124.0 lb

## 2014-04-12 DIAGNOSIS — E785 Hyperlipidemia, unspecified: Secondary | ICD-10-CM

## 2014-04-12 DIAGNOSIS — I10 Essential (primary) hypertension: Secondary | ICD-10-CM

## 2014-04-12 DIAGNOSIS — E876 Hypokalemia: Secondary | ICD-10-CM

## 2014-04-12 DIAGNOSIS — I340 Nonrheumatic mitral (valve) insufficiency: Secondary | ICD-10-CM

## 2014-04-12 DIAGNOSIS — B0229 Other postherpetic nervous system involvement: Secondary | ICD-10-CM

## 2014-04-12 DIAGNOSIS — M8580 Other specified disorders of bone density and structure, unspecified site: Secondary | ICD-10-CM

## 2014-04-12 DIAGNOSIS — K219 Gastro-esophageal reflux disease without esophagitis: Secondary | ICD-10-CM

## 2014-04-12 DIAGNOSIS — Z950 Presence of cardiac pacemaker: Secondary | ICD-10-CM

## 2014-04-12 DIAGNOSIS — E0590 Thyrotoxicosis, unspecified without thyrotoxic crisis or storm: Secondary | ICD-10-CM | POA: Diagnosis not present

## 2014-04-12 LAB — TSH: TSH: 0.63 u[IU]/mL (ref 0.35–4.50)

## 2014-04-12 LAB — T4, FREE: Free T4: 0.88 ng/dL (ref 0.60–1.60)

## 2014-04-12 NOTE — Patient Instructions (Addendum)
blood tests are being requested for you today.  We'll let you know about the results. if ever you have fever while taking methimazole, stop it and call us, because of the risk of a rare side-effect We can reconsider the radioactive iodine in the future if you want.   Please come back for a follow-up appointment in 4-6 weeks.           Hyperthyroidism The thyroid is a large gland located in the lower front part of your neck. The thyroid helps control metabolism. Metabolism is how your body uses food. It controls metabolism with the hormone thyroxine. When the thyroid is overactive, it produces too much hormone. When this happens, these following problems may occur:   Nervousness  Heat intolerance  Weight loss (in spite of increase food intake)  Diarrhea  Change in hair or skin texture  Palpitations (heart skipping or having extra beats)  Tachycardia (rapid heart rate)  Loss of menstruation (amenorrhea)  Shaking of the hands CAUSES  Grave's Disease (the immune system attacks the thyroid gland). This is the most common cause.  Inflammation of the thyroid gland.  Tumor (usually benign) in the thyroid gland or elsewhere.  Excessive use of thyroid medications (both prescription and 'natural').  Excessive ingestion of Iodine. DIAGNOSIS  To prove hyperthyroidism, your caregiver may do blood tests and ultrasound tests. Sometimes the signs are hidden. It may be necessary for your caregiver to watch this illness with blood tests, either before or after diagnosis and treatment. TREATMENT Short-term treatment There are several treatments to control symptoms. Drugs called beta blockers may give some relief. Drugs that decrease hormone production will provide temporary relief in many people. These measures will usually not give permanent relief. Definitive therapy There are treatments available which can be discussed between you and your caregiver which will permanently treat the  problem. These treatments range from surgery (removal of the thyroid), to the use of radioactive iodine (destroys the thyroid by radiation), to the use of antithyroid drugs (interfere with hormone synthesis). The first two treatments are permanent and usually successful. They most often require hormone replacement therapy for life. This is because it is impossible to remove or destroy the exact amount of thyroid required to make a person euthyroid (normal). HOME CARE INSTRUCTIONS  See your caregiver if the problems you are being treated for get worse. Examples of this would be the problems listed above. SEEK MEDICAL CARE IF: Your general condition worsens. MAKE SURE YOU:   Understand these instructions.  Will watch your condition.  Will get help right away if you are not doing well or get worse. Document Released: 03/24/2005 Document Revised: 06/16/2011 Document Reviewed: 08/05/2006 [facility] Patient Information 2015 [place], [place]. This information is not intended to replace advice given to you by your health care provider. Make sure you discuss any questions you have with your health care provider.

## 2014-04-12 NOTE — Progress Notes (Signed)
Subjective:    Patient ID: Catherine Sullivan, female    DOB: [birth year], 81 y.o.   MRN: [id number]  HPI Pt reports he was dx'ed with hyperthyroidism in mid-2015.  She was rx'ed tapazole, but son says he is unaware if any other rx was considered.  she has never had XRT to the anterior neck, or thyroid surgery.  she has never had thyroid imaging.  she does not consume kelp or any other prescribed or non-prescribed thyroid medication.  she has never been on amiodarone.  Pt reports slight tremor of the hands, and assoc fatigue.  Pt is here with 2 sons, who provide hx, and administer medication.   Past Medical History  Diagnosis Date  . Pacemaker   . Hypertension   . High cholesterol     No past surgical history on file.  History   Social History  . Marital Status: Widowed    Spouse Name: N/A    Number of Children: N/A  . Years of Education: N/A   Occupational History  . Not on file.   Social History Main Topics  . Smoking status: Never Smoker   . Smokeless tobacco: Not on file  . Alcohol Use: No  . Drug Use: Not on file  . Sexual Activity: Not on file   Other Topics Concern  . Not on file   Social History Narrative    Current Outpatient Prescriptions on File Prior to Visit  Medication Sig Dispense Refill  . aspirin 325 MG tablet Take 1 tablet (325 mg total) by mouth daily. 30 tablet 0  . atenolol (TENORMIN) 25 MG tablet Take 25 mg by mouth 2 (two) times daily.     . cholecalciferol (VITAMIN D) 1000 UNITS tablet Take 1,000 Units by mouth daily.    Marland Kitchen l-methylfolate-B6-B12 (METANX) 3-35-2 MG TABS Take 1 tablet by mouth daily.    . potassium chloride SA (K-DUR,KLOR-CON) 20 MEQ tablet Take 20 mEq by mouth 2 (two) times daily.     Marland Kitchen escitalopram (LEXAPRO) 10 MG tablet Take 10 mg by mouth daily.    Marland Kitchen losartan (COZAAR) 25 MG tablet Take 25 mg by mouth daily.    . simvastatin (ZOCOR) 40 MG tablet Take 40 mg by mouth daily at 6 PM.      No current facility-administered medications  on file prior to visit.    Allergies  Allergen Reactions  . Hydrocodone     Drowsiness   . Lyrica [Pregabalin]     Pt states she had multiple symptoms and had to be taken to the emergency room.  . Oxycodone     Drowsiness    Family History  Problem Relation Age of Onset  . Thyroid disease Sister   . Thyroid disease Son     BP 130/84 mmHg  Pulse 98  Temp(Src) 97.7 F (36.5 C) (Oral)  Ht 5' 1.5" (1.562 m)  Wt 124 lb (56.246 kg)  BMI 23.05 kg/m2  SpO2 96%  Review of Systems denies weight loss, headache, hoarseness, double vision, palpitations, sob, diarrhea, polyuria, muscle weakness, excessive diaphoresis, numbness, heat intolerance, and easy bruising.  Pt reports hair loss, rhinorrhea, and anxiety.      Objective:   Physical Exam VS: see vs page GEN: no distress HEAD: head: no deformity eyes: no periorbital swelling, no proptosis external nose and ears are normal mouth: no lesion seen NECK: supple, thyroid is not enlarged to my exam.   CHEST WALL: no deformity LUNGS:  Clear to auscultation.  CV: reg rate and rhythm, no murmur ABD: abdomen is soft, nontender.  no hepatosplenomegaly.  not distended.  no hernia MUSCULOSKELETAL: muscle bulk and strength are grossly normal.  no obvious joint swelling.  gait is slow but steady EXTEMITIES: no deformity.  no edema.   NEURO:  cn 2-12 grossly intact.   readily moves all 4's.  sensation is intact to touch on all 4's.  Slight tremor of the hands. SKIN:  Normal texture and temperature.  No rash or suspicious lesion is visible.  Not diaphoretic.  NODES:  None palpable at the neck.   PSYCH: alert, well-oriented (except pt says it is 04/14/14).  Does not appear anxious nor depressed.     outside test results are reviewed: 03/22/14: TSH=undetectable Free t4=2.1  Lab Results  Component Value Date   TSH 0.63 04/12/2014      Assessment & Plan:  Hyperthyroidism, new to me, well-controlled Anxiety, not  thyroid-related. Cardiac dysrhythmia, uncertain type, apparently well-controlled.  Control of hyperthyroidism helps this control.   frail elderly state.  This is a relative contraindication to I-131, as it would be difficult to quarantine her.  However, sons say this would be possible.     Patient is advised the following: Patient Instructions  blood tests are being requested for you today.  We'll let you know about the results. if ever you have fever while taking methimazole, stop it and call us, because of the risk of a rare side-effect We can reconsider the radioactive iodine in the future if you want.   Please come back for a follow-up appointment in 4-6 weeks.           Hyperthyroidism The thyroid is a large gland located in the lower front part of your neck. The thyroid helps control metabolism. Metabolism is how your body uses food. It controls metabolism with the hormone thyroxine. When the thyroid is overactive, it produces too much hormone. When this happens, these following problems may occur:   Nervousness  Heat intolerance  Weight loss (in spite of increase food intake)  Diarrhea  Change in hair or skin texture  Palpitations (heart skipping or having extra beats)  Tachycardia (rapid heart rate)  Loss of menstruation (amenorrhea)  Shaking of the hands CAUSES  Grave's Disease (the immune system attacks the thyroid gland). This is the most common cause.  Inflammation of the thyroid gland.  Tumor (usually benign) in the thyroid gland or elsewhere.  Excessive use of thyroid medications (both prescription and 'natural').  Excessive ingestion of Iodine. DIAGNOSIS  To prove hyperthyroidism, your caregiver may do blood tests and ultrasound tests. Sometimes the signs are hidden. It may be necessary for your caregiver to watch this illness with blood tests, either before or after diagnosis and treatment. TREATMENT Short-term treatment There are several  treatments to control symptoms. Drugs called beta blockers may give some relief. Drugs that decrease hormone production will provide temporary relief in many people. These measures will usually not give permanent relief. Definitive therapy There are treatments available which can be discussed between you and your caregiver which will permanently treat the problem. These treatments range from surgery (removal of the thyroid), to the use of radioactive iodine (destroys the thyroid by radiation), to the use of antithyroid drugs (interfere with hormone synthesis). The first two treatments are permanent and usually successful. They most often require hormone replacement therapy for life. This is because it is impossible to remove or destroy the exact amount of thyroid required to make  a person euthyroid (normal). HOME CARE INSTRUCTIONS  See your caregiver if the problems you are being treated for get worse. Examples of this would be the problems listed above. SEEK MEDICAL CARE IF: Your general condition worsens. MAKE SURE YOU:   Understand these instructions.  Will watch your condition.  Will get help right away if you are not doing well or get worse. Document Released: 03/24/2005 Document Revised: 06/16/2011 Document Reviewed: 08/05/2006 [facility]ExitCare Patient Information 2015 [place], [place]. This information is not intended to replace advice given to you by your health care provider. Make sure you discuss any questions you have with your health care provider.

## 2014-04-13 DIAGNOSIS — I340 Nonrheumatic mitral (valve) insufficiency: Secondary | ICD-10-CM | POA: Insufficient documentation

## 2014-04-13 DIAGNOSIS — I10 Essential (primary) hypertension: Secondary | ICD-10-CM | POA: Insufficient documentation

## 2014-04-13 DIAGNOSIS — K219 Gastro-esophageal reflux disease without esophagitis: Secondary | ICD-10-CM | POA: Insufficient documentation

## 2014-04-13 DIAGNOSIS — B0229 Other postherpetic nervous system involvement: Secondary | ICD-10-CM | POA: Insufficient documentation

## 2014-04-13 DIAGNOSIS — Z950 Presence of cardiac pacemaker: Secondary | ICD-10-CM | POA: Insufficient documentation

## 2014-04-13 DIAGNOSIS — E785 Hyperlipidemia, unspecified: Secondary | ICD-10-CM | POA: Insufficient documentation

## 2014-04-13 DIAGNOSIS — M8580 Other specified disorders of bone density and structure, unspecified site: Secondary | ICD-10-CM | POA: Insufficient documentation

## 2014-04-13 DIAGNOSIS — E876 Hypokalemia: Secondary | ICD-10-CM | POA: Insufficient documentation

## 2014-05-02 ENCOUNTER — Encounter: Payer: Self-pay | Admitting: Endocrinology

## 2014-05-10 ENCOUNTER — Ambulatory Visit: Payer: Medicare Other | Admitting: Endocrinology

## 2014-05-19 ENCOUNTER — Ambulatory Visit (INDEPENDENT_AMBULATORY_CARE_PROVIDER_SITE_OTHER): Payer: Medicare Other | Admitting: Endocrinology

## 2014-05-19 ENCOUNTER — Encounter: Payer: Self-pay | Admitting: Endocrinology

## 2014-05-19 VITALS — BP 132/84 | HR 91 | Temp 98.5°F | Ht 61.5 in | Wt 126.0 lb

## 2014-05-19 DIAGNOSIS — E0590 Thyrotoxicosis, unspecified without thyrotoxic crisis or storm: Secondary | ICD-10-CM | POA: Diagnosis not present

## 2014-05-19 LAB — T4, FREE: FREE T4: 0.43 ng/dL — AB (ref 0.60–1.60)

## 2014-05-19 LAB — TSH: TSH: 18.64 u[IU]/mL — AB (ref 0.35–4.50)

## 2014-05-19 NOTE — Progress Notes (Signed)
Subjective:    Patient ID: Catherine Sullivan, female    DOB: 1928/09/29, 81 y.o.   MRN: [id number]  HPI Pt returns for f/u of hyperthyroidism (dx'ed in mid-2015; she was rx'ed tapazole, but son says he is unaware if any other rx was considered; she has never had dedicated thyroid imaging, but neck CT in 2015 made no mention of the thyroid).   slight tremor of the hands persists.  Pt is here with her son, who provides hx, and administer medication.  Past Medical History  Diagnosis Date  . Pacemaker   . Hypertension   . High cholesterol     No past surgical history on file.  History   Social History  . Marital Status: Widowed    Spouse Name: N/A  . Number of Children: N/A  . Years of Education: N/A   Occupational History  . Not on file.   Social History Main Topics  . Smoking status: Never Smoker   . Smokeless tobacco: Not on file  . Alcohol Use: No  . Drug Use: Not on file  . Sexual Activity: Not on file   Other Topics Concern  . Not on file   Social History Narrative    Current Outpatient Prescriptions on File Prior to Visit  Medication Sig Dispense Refill  . aspirin 325 MG tablet Take 1 tablet (325 mg total) by mouth daily. 30 tablet 0  . atenolol (TENORMIN) 25 MG tablet Take 25 mg by mouth 2 (two) times daily.     . cholecalciferol (VITAMIN D) 1000 UNITS tablet Take 1,000 Units by mouth daily.    . clonazePAM (KLONOPIN) 0.5 MG tablet Take 0.5 mg by mouth 2 (two) times daily as needed for anxiety.    . DULoxetine (CYMBALTA) 30 MG capsule Take 30 mg by mouth daily.    Marland Kitchen. escitalopram (LEXAPRO) 10 MG tablet Take 10 mg by mouth daily.    Marland Kitchen. l-methylfolate-B6-B12 (METANX) 3-35-2 MG TABS Take 1 tablet by mouth daily.    Marland Kitchen. losartan (COZAAR) 25 MG tablet Take 25 mg by mouth daily.    . methimazole (TAPAZOLE) 10 MG tablet Take 10 mg by mouth daily.     . potassium chloride SA (K-DUR,KLOR-CON) 20 MEQ tablet Take 20 mEq by mouth 2 (two) times daily.     . simvastatin (ZOCOR) 40  MG tablet Take 40 mg by mouth daily at 6 PM.      No current facility-administered medications on file prior to visit.    Allergies  Allergen Reactions  . Hydrocodone     Drowsiness   . Lyrica [Pregabalin]     Pt states she had multiple symptoms and had to be taken to the emergency room.  . Oxycodone     Drowsiness    Family History  Problem Relation Age of Onset  . Thyroid disease Sister   . Thyroid disease Son     BP 132/84 mmHg  Pulse 91  Temp(Src) 98.5 F (36.9 C) (Oral)  Ht 5' 1.5" (1.562 m)  Wt 126 lb (57.153 kg)  BMI 23.42 kg/m2  SpO2 95%   Review of Systems She denies fever    Objective:   Physical Exam VITAL SIGNS:  See vs page GENERAL: no distress Skin: not diaphoretic Neuro: no tremor  Lab Results  Component Value Date   TSH 18.64* 05/19/2014       Assessment & Plan:  Hyperthyroidism: overcontrolled.   Patient is advised the following: Patient Instructions  blood tests are  being requested for you today.  We'll let you know about the results. if ever you have fever while taking methimazole, stop it and call us, because of the risk of a rare side-effect.   Please come back for a follow-up appointment in 2-3 months.

## 2014-05-19 NOTE — Patient Instructions (Signed)
blood tests are being requested for you today.  We'll let you know about the results. if ever you have fever while taking methimazole, stop it and call us, because of the risk of a rare side-effect.   Please come back for a follow-up appointment in 2-3 months.

## 2014-06-19 ENCOUNTER — Telehealth: Payer: Self-pay | Admitting: Endocrinology

## 2014-06-19 NOTE — Telephone Encounter (Signed)
[facility] note: FYI-New onset of confusion. Caller states wants Dr.Ellison to call; mother is having problem with thyroid; has hyperthyroidism; on 10 mg to prevent producing hormone; making no sense, very upset; has appt wed; confusion getting worse even on meds; now worse than ever; has pacemaker; urg per charge;told to go to er

## 2014-06-20 NOTE — Telephone Encounter (Signed)
Contacted pt's son and advised of note below. Pt's son wanted to know if Dr. Everardo AllEllison could admit her to the hospital so she would not have to go through the ED. I advised son per Dr. Everardo AllEllison he would not be able to admit her to the hospital. Pt's son voiced understanding and stated he will call us back and let us know what he has decided to do.

## 2014-06-20 NOTE — Telephone Encounter (Signed)
Please advise pt to go ER, not here

## 2014-06-20 NOTE — Telephone Encounter (Signed)
See note below and please advise, Thanks! 

## 2014-06-21 ENCOUNTER — Encounter: Payer: Self-pay | Admitting: Endocrinology

## 2014-06-21 ENCOUNTER — Ambulatory Visit (INDEPENDENT_AMBULATORY_CARE_PROVIDER_SITE_OTHER): Payer: Medicare Other | Admitting: Endocrinology

## 2014-06-21 VITALS — BP 136/80 | HR 83 | Temp 97.6°F | Ht 61.5 in | Wt 127.0 lb

## 2014-06-21 DIAGNOSIS — E0590 Thyrotoxicosis, unspecified without thyrotoxic crisis or storm: Secondary | ICD-10-CM | POA: Diagnosis not present

## 2014-06-21 LAB — TSH: TSH: 7.14 u[IU]/mL — ABNORMAL HIGH (ref 0.35–4.50)

## 2014-06-21 LAB — T4, FREE: Free T4: 0.59 ng/dL — ABNORMAL LOW (ref 0.60–1.60)

## 2014-06-21 NOTE — Progress Notes (Signed)
Subjective:    Patient ID: Catherine Sullivan, female    DOB: 08/18/1928, 81 y.o.   MRN: [id number]  HPI Pt returns for f/u of hyperthyroidism (dx'ed in mid-2015; she was rx'ed tapazole, but son says he is unaware if any other rx was considered; she has never had dedicated thyroid imaging, but neck CT in 2015 made no mention of the thyroid).  Pt is here with her son, who provides hx, and administer medication. Pt's son says she was taking just 10 mg qd of the tapazole, prior to the blood test last month.  pt states he feels well in general, but son says pt is confused.  Past Medical History  Diagnosis Date  . Pacemaker   . Hypertension   . High cholesterol     No past surgical history on file.  History   Social History  . Marital Status: Widowed    Spouse Name: N/A  . Number of Children: N/A  . Years of Education: N/A   Occupational History  . Not on file.   Social History Main Topics  . Smoking status: Never Smoker   . Smokeless tobacco: Not on file  . Alcohol Use: No  . Drug Use: Not on file  . Sexual Activity: Not on file   Other Topics Concern  . Not on file   Social History Narrative    Current Outpatient Prescriptions on File Prior to Visit  Medication Sig Dispense Refill  . aspirin 325 MG tablet Take 1 tablet (325 mg total) by mouth daily. 30 tablet 0  . atenolol (TENORMIN) 25 MG tablet Take 25 mg by mouth 2 (two) times daily.     . cholecalciferol (VITAMIN D) 1000 UNITS tablet Take 1,000 Units by mouth daily.    . clonazePAM (KLONOPIN) 0.5 MG tablet Take 0.5 mg by mouth 2 (two) times daily as needed for anxiety.    . DULoxetine (CYMBALTA) 30 MG capsule Take 30 mg by mouth daily.    Marland Kitchen escitalopram (LEXAPRO) 10 MG tablet Take 10 mg by mouth daily.    Marland Kitchen l-methylfolate-B6-B12 (METANX) 3-35-2 MG TABS Take 1 tablet by mouth daily.    Marland Kitchen losartan (COZAAR) 25 MG tablet Take 25 mg by mouth daily.    . potassium chloride SA (K-DUR,KLOR-CON) 20 MEQ tablet Take 20 mEq by  mouth 2 (two) times daily.     . simvastatin (ZOCOR) 40 MG tablet Take 40 mg by mouth daily at 6 PM.      No current facility-administered medications on file prior to visit.    Allergies  Allergen Reactions  . Hydrocodone     Drowsiness   . Lyrica [Pregabalin]     Pt states she had multiple symptoms and had to be taken to the emergency room.  . Oxycodone     Drowsiness    Family History  Problem Relation Age of Onset  . Thyroid disease Sister   . Thyroid disease Son     BP 136/80 mmHg  Pulse 83  Temp(Src) 97.6 F (36.4 C) (Oral)  Ht 5' 1.5" (1.562 m)  Wt 127 lb (57.607 kg)  BMI 23.61 kg/m2  SpO2 96%  Review of Systems Denies weight change.  Tremor persists    Objective:   Physical Exam VITAL SIGNS:  See vs page GENERAL: no distress NECK: There is no palpable thyroid enlargement.  No thyroid nodule is palpable.  No palpable lymphadenopathy at the anterior neck.   Neuro: slight tremor of the hands  Lab Results  Component Value Date   TSH 7.14* 06/21/2014      Assessment & Plan:  Hyperthyroidism: slightly overcontrolled  Patient is advised the following: Patient Instructions  blood tests are being requested for you today.  We'll let you know about the results.   if ever you have fever while taking methimazole, stop it and call us, because of the risk of a rare side-effect.   Please come back for a follow-up appointment in 2 months.     addendum: decrease tapazole to 5 mg/d

## 2014-06-21 NOTE — Patient Instructions (Addendum)
blood tests are being requested for you today.  We'll let you know about the results.  if ever you have fever while taking methimazole, stop it and call us, because of the risk of a rare side-effect.    Please come back for a follow-up appointment in 2 months.      

## 2014-06-22 MED ORDER — METHIMAZOLE 5 MG PO TABS: 5.0000 mg | ORAL_TABLET | Freq: Every day | ORAL | Status: DC

## 2014-06-30 ENCOUNTER — Ambulatory Visit: Payer: Medicare Other | Admitting: Endocrinology

## 2014-07-18 ENCOUNTER — Telehealth: Payer: Self-pay | Admitting: Endocrinology

## 2014-07-18 NOTE — Telephone Encounter (Signed)
Patients son called stating Catherine Sullivan arm is hurting and the (L) ring finger is back to the palm They are wanting [facility]   How would they go about doing this as her PCP is in [place]   Please advise    Thank you

## 2014-07-18 NOTE — Telephone Encounter (Signed)
Pt's son advised Dr. Everardo AllEllison does not have hospital privileges and we can not admit to [facility].

## 2014-08-18 ENCOUNTER — Ambulatory Visit (INDEPENDENT_AMBULATORY_CARE_PROVIDER_SITE_OTHER): Payer: Medicare Other | Admitting: Endocrinology

## 2014-08-18 ENCOUNTER — Encounter: Payer: Self-pay | Admitting: Endocrinology

## 2014-08-18 VITALS — BP 132/86 | HR 94 | Temp 97.8°F | Wt 129.0 lb

## 2014-08-18 DIAGNOSIS — E0590 Thyrotoxicosis, unspecified without thyrotoxic crisis or storm: Secondary | ICD-10-CM

## 2014-08-18 LAB — T4, FREE: Free T4: 0.46 ng/dL — ABNORMAL LOW (ref 0.60–1.60)

## 2014-08-18 LAB — TSH: TSH: 18.26 u[IU]/mL — AB (ref 0.35–4.50)

## 2014-08-18 NOTE — Patient Instructions (Addendum)
blood tests are being requested for you today.  We'll let you know about the results.   if ever you have fever while taking methimazole, stop it and call us, because of the risk of a rare side-effect.   Please come back for a follow-up appointment in 2 months.   Please call or message us today, to verify the dosage of methimazole.

## 2014-08-18 NOTE — Progress Notes (Signed)
Subjective:    Patient ID: Catherine Sullivan, female    DOB: 1928-12-27, 81 y.o.   MRN: [id number]  HPI Pt returns for f/u of hyperthyroidism (dx'ed in mid-2015; she was rx'ed tapazole, but son says he is unaware if any other rx was considered; she has never had dedicated thyroid imaging, but neck CT in 2015 made no mention of the thyroid; son administers pt's medication).  Pt is here today with a Program[employer], multimediahealth-care worker.  They do not know pt's tapazole dosage.  pt states she feels well in general.  Specifically, she denies tremor.   Past Medical History  Diagnosis Date  . Pacemaker   . Hypertension   . High cholesterol     No past surgical history on file.  History   Social History  . Marital Status: Widowed    Spouse Name: N/A  . Number of Children: N/A  . Years of Education: N/A   Occupational History  . Not on file.   Social History Main Topics  . Smoking status: Never Smoker   . Smokeless tobacco: Not on file  . Alcohol Use: No  . Drug Use: Not on file  . Sexual Activity: Not on file   Other Topics Concern  . Not on file   Social History Narrative    Current Outpatient Prescriptions on File Prior to Visit  Medication Sig Dispense Refill  . aspirin 325 MG tablet Take 1 tablet (325 mg total) by mouth daily. 30 tablet 0  . atenolol (TENORMIN) 25 MG tablet Take 25 mg by mouth 2 (two) times daily.     . cholecalciferol (VITAMIN D) 1000 UNITS tablet Take 1,000 Units by mouth daily.    . clonazePAM (KLONOPIN) 0.5 MG tablet Take 0.5 mg by mouth 2 (two) times daily as needed for anxiety.    . DULoxetine (CYMBALTA) 30 MG capsule Take 30 mg by mouth daily.    Marland Kitchen. escitalopram (LEXAPRO) 10 MG tablet Take 10 mg by mouth daily.    Marland Kitchen. l-methylfolate-B6-B12 (METANX) 3-35-2 MG TABS Take 1 tablet by mouth daily.    Marland Kitchen. losartan (COZAAR) 25 MG tablet Take 25 mg by mouth daily.    . methimazole (TAPAZOLE) 5 MG tablet Take 1 tablet (5 mg total) by mouth daily. 30 tablet 3  . potassium  chloride SA (K-DUR,KLOR-CON) 20 MEQ tablet Take 20 mEq by mouth 2 (two) times daily.     . simvastatin (ZOCOR) 40 MG tablet Take 40 mg by mouth daily at 6 PM.      No current facility-administered medications on file prior to visit.    Allergies  Allergen Reactions  . Hydrocodone     Drowsiness   . Lyrica [Pregabalin]     Pt states she had multiple symptoms and had to be taken to the emergency room.  . Oxycodone     Drowsiness    Family History  Problem Relation Age of Onset  . Thyroid disease Sister   . Thyroid disease Son     BP 132/86 mmHg  Pulse 94  Temp(Src) 97.8 F (36.6 C) (Oral)  Wt 129 lb (58.514 kg)  SpO2 95%   Review of Systems Denies fever    Objective:   Physical Exam VITAL SIGNS:  See vs page GENERAL: no distress Skin: not diaphoretic Neuro: no tremor   Lab Results  Component Value Date   TSH 18.26* 08/18/2014      Assessment & Plan:  Hyperthyroidism: still overcontrolled.  Patient is advised the following:  Patient Instructions  blood tests are being requested for you today.  We'll let you know about the results.   if ever you have fever while taking methimazole, stop it and call us, because of the risk of a rare side-effect.   Please come back for a follow-up appointment in 2 months.   Please call or message us today, to verify the dosage of methimazole.    addendum: d/c tapazole.

## 2014-08-21 ENCOUNTER — Ambulatory Visit: Payer: Medicare Other | Admitting: Endocrinology

## 2014-10-18 ENCOUNTER — Ambulatory Visit: Payer: Medicare Other | Admitting: Endocrinology

## 2014-10-27 ENCOUNTER — Ambulatory Visit: Payer: Medicare Other | Admitting: Endocrinology

## 2015-02-08 ENCOUNTER — Encounter: Payer: Self-pay | Admitting: Endocrinology

## 2015-02-08 ENCOUNTER — Ambulatory Visit (INDEPENDENT_AMBULATORY_CARE_PROVIDER_SITE_OTHER): Payer: Medicare Other | Admitting: Endocrinology

## 2015-02-08 VITALS — BP 127/84 | HR 79 | Temp 98.1°F | Ht 61.5 in | Wt 135.0 lb

## 2015-02-08 DIAGNOSIS — E0590 Thyrotoxicosis, unspecified without thyrotoxic crisis or storm: Secondary | ICD-10-CM | POA: Diagnosis not present

## 2015-02-08 NOTE — Patient Instructions (Signed)
The next step is to know the name of the medical office you went to last week.   Please let us know.

## 2015-02-08 NOTE — Progress Notes (Signed)
Subjective:    Patient ID: Catherine Sullivan, female    DOB: September 13, 1928, 81 y.o.   MRN: [id number]  HPI Pt returns for f/u of hyperthyroidism (dx'ed in mid-2015; she was rx'ed tapazole, but son says he is unaware if any other rx was considered; she has never had dedicated thyroid imaging, but neck CT in 2015 made no mention of the thyroid; son administers pt's medication; she lives in [place] most of the time; in may of 2016, tapazole was stopped, due to hypothyroidism).  She is here alone today.  She does not know if she takes tapazole.  She says she had TFT last week in [place], but does not know the name of the practice.  She says it is not [facility] we have on file.  Pt says her son helps her, but does not have [facility]. Past Medical History  Diagnosis Date  . Pacemaker   . Hypertension   . High cholesterol     No past surgical history on file.  Social History   Social History  . Marital Status: Widowed    Spouse Name: N/A  . Number of Children: N/A  . Years of Education: N/A   Occupational History  . Not on file.   Social History Main Topics  . Smoking status: Never Smoker   . Smokeless tobacco: Not on file  . Alcohol Use: No  . Drug Use: Not on file  . Sexual Activity: Not on file   Other Topics Concern  . Not on file   Social History Narrative    Current Outpatient Prescriptions on File Prior to Visit  Medication Sig Dispense Refill  . aspirin 325 MG tablet Take 1 tablet (325 mg total) by mouth daily. 30 tablet 0  . atenolol (TENORMIN) 25 MG tablet Take 25 mg by mouth 2 (two) times daily.     . cholecalciferol (VITAMIN D) 1000 UNITS tablet Take 1,000 Units by mouth daily.    . clonazePAM (KLONOPIN) 0.5 MG tablet Take 0.5 mg by mouth 2 (two) times daily as needed for anxiety.    . DULoxetine (CYMBALTA) 30 MG capsule Take 30 mg by mouth daily.    Marland Kitchen escitalopram (LEXAPRO) 10 MG tablet Take 10 mg by mouth daily.    Marland Kitchen l-methylfolate-B6-B12 (METANX) 3-35-2 MG  TABS Take 1 tablet by mouth daily.    Marland Kitchen losartan (COZAAR) 25 MG tablet Take 25 mg by mouth daily.    . potassium chloride SA (K-DUR,KLOR-CON) 20 MEQ tablet Take 20 mEq by mouth 2 (two) times daily.     . simvastatin (ZOCOR) 40 MG tablet Take 40 mg by mouth daily at 6 PM.      No current facility-administered medications on file prior to visit.    Allergies  Allergen Reactions  . Hydrocodone     Drowsiness   . Lyrica [Pregabalin]     Pt states she had multiple symptoms and had to be taken to the emergency room.  . Oxycodone     Drowsiness    Family History  Problem Relation Age of Onset  . Thyroid disease Sister   . Thyroid disease Son     BP 127/84 mmHg  Pulse 79  Temp(Src) 98.1 F (36.7 C) (Oral)  Ht 5' 1.5" (1.562 m)  Wt 135 lb (61.236 kg)  BMI 25.10 kg/m2  SpO2 96%   Review of Systems Denies fever    Objective:   Physical Exam VITAL SIGNS:  See vs page GENERAL: no distress Skin:  not diaphoretic Neuro: no tremor     Assessment & Plan:  Hyperthyroidism: therapy limited by lack of information.  i told pt we need better information in order to properly treat her.  [facility] just in case that will help.    Patient is advised the following: Patient Instructions  The next step is to know the name of the medical office you went to last week.   Please let us know.

## 2017-10-13 ENCOUNTER — Other Ambulatory Visit: Payer: Self-pay

## 2017-10-13 ENCOUNTER — Emergency Department (HOSPITAL_COMMUNITY)
Admission: EM | Admit: 2017-10-13 | Discharge: 2017-10-13 | Disposition: A | Discharge status: Home / Self Care | Payer: Medicare Other | Attending: Emergency Medicine | Admitting: Emergency Medicine

## 2017-10-13 ENCOUNTER — Emergency Department (HOSPITAL_COMMUNITY): Payer: Medicare Other

## 2017-10-13 ENCOUNTER — Encounter (HOSPITAL_COMMUNITY): Payer: Self-pay | Admitting: Emergency Medicine

## 2017-10-13 DIAGNOSIS — I10 Essential (primary) hypertension: Secondary | ICD-10-CM | POA: Insufficient documentation

## 2017-10-13 DIAGNOSIS — E860 Dehydration: Secondary | ICD-10-CM | POA: Diagnosis not present

## 2017-10-13 DIAGNOSIS — Z79899 Other long term (current) drug therapy: Secondary | ICD-10-CM | POA: Diagnosis not present

## 2017-10-13 DIAGNOSIS — Y939 Activity, unspecified: Secondary | ICD-10-CM | POA: Diagnosis not present

## 2017-10-13 DIAGNOSIS — Z7982 Long term (current) use of aspirin: Secondary | ICD-10-CM | POA: Diagnosis not present

## 2017-10-13 DIAGNOSIS — Y999 Unspecified external cause status: Secondary | ICD-10-CM | POA: Diagnosis not present

## 2017-10-13 DIAGNOSIS — Y929 Unspecified place or not applicable: Secondary | ICD-10-CM | POA: Diagnosis not present

## 2017-10-13 DIAGNOSIS — W06XXXA Fall from bed, initial encounter: Secondary | ICD-10-CM | POA: Insufficient documentation

## 2017-10-13 DIAGNOSIS — Z950 Presence of cardiac pacemaker: Secondary | ICD-10-CM | POA: Diagnosis not present

## 2017-10-13 DIAGNOSIS — W19XXXA Unspecified fall, initial encounter: Secondary | ICD-10-CM

## 2017-10-13 DIAGNOSIS — S0990XA Unspecified injury of head, initial encounter: Secondary | ICD-10-CM | POA: Diagnosis present

## 2017-10-13 DIAGNOSIS — S060X0A Concussion without loss of consciousness, initial encounter: Secondary | ICD-10-CM | POA: Diagnosis not present

## 2017-10-13 LAB — URINALYSIS, MICROSCOPIC (REFLEX)

## 2017-10-13 LAB — CBC WITH DIFFERENTIAL/PLATELET
Abs Immature Granulocytes: 0 10*3/uL (ref 0.0–0.1)
BASOS PCT: 0 %
Basophils Absolute: 0 10*3/uL (ref 0.0–0.1)
EOS ABS: 0.2 10*3/uL (ref 0.0–0.7)
Eosinophils Relative: 3 %
HCT: 36.6 % (ref 36.0–46.0)
Hemoglobin: 12.4 g/dL (ref 12.0–15.0)
IMMATURE GRANULOCYTES: 0 %
Lymphocytes Relative: 32 %
Lymphs Abs: 1.8 10*3/uL (ref 0.7–4.0)
MCH: 31.2 pg (ref 26.0–34.0)
MCHC: 33.9 g/dL (ref 30.0–36.0)
MCV: 92.2 fL (ref 78.0–100.0)
Monocytes Absolute: 0.5 10*3/uL (ref 0.1–1.0)
Monocytes Relative: 9 %
NEUTROS PCT: 56 %
Neutro Abs: 3 10*3/uL (ref 1.7–7.7)
PLATELETS: 193 10*3/uL (ref 150–400)
RBC: 3.97 MIL/uL (ref 3.87–5.11)
RDW: 12.7 % (ref 11.5–15.5)
WBC: 5.4 10*3/uL (ref 4.0–10.5)

## 2017-10-13 LAB — URINALYSIS, ROUTINE W REFLEX MICROSCOPIC
Bilirubin Urine: NEGATIVE
GLUCOSE, UA: NEGATIVE mg/dL
KETONES UR: NEGATIVE mg/dL
NITRITE: NEGATIVE
PROTEIN: NEGATIVE mg/dL
Specific Gravity, Urine: 1.01 (ref 1.005–1.030)
pH: 7.5 (ref 5.0–8.0)

## 2017-10-13 LAB — BASIC METABOLIC PANEL
ANION GAP: 10 (ref 5–15)
BUN: 18 mg/dL (ref 8–23)
CALCIUM: 9.3 mg/dL (ref 8.9–10.3)
CO2: 27 mmol/L (ref 22–32)
Chloride: 98 mmol/L (ref 98–111)
Creatinine, Ser: 1.01 mg/dL — ABNORMAL HIGH (ref 0.44–1.00)
GFR calc Af Amer: 57 mL/min — ABNORMAL LOW (ref >60)
GFR, EST NON AFRICAN AMERICAN: 49 mL/min — AB (ref >60)
Glucose, Bld: 101 mg/dL — ABNORMAL HIGH (ref 70–99)
Potassium: 3.8 mmol/L (ref 3.5–5.1)
SODIUM: 135 mmol/L (ref 135–145)

## 2017-10-13 MED ORDER — SODIUM CHLORIDE 0.9 % IV BOLUS
500.0000 mL | Freq: Once | INTRAVENOUS | Status: AC
  Administered 2017-10-13: 500 mL via INTRAVENOUS

## 2017-10-13 MED ORDER — IOPAMIDOL (ISOVUE-370) INJECTION 76%
50.0000 mL | Freq: Once | INTRAVENOUS | Status: AC | PRN
  Administered 2017-10-13: 50 mL via INTRAVENOUS

## 2017-10-13 MED ORDER — IOPAMIDOL (ISOVUE-370) INJECTION 76%: 50.0000 mL | Freq: Once | INTRAVENOUS | Status: DC | PRN

## 2017-10-13 NOTE — ED Triage Notes (Signed)
Son stated, she fell out of bed 2 nights ago and hit her head . She has not been taking her medicine regular. This morning she thought it was night time. It was 700 in morning. And she might have some vision changes.  Pt. Stated, when I read it seems like the things are coming out of the pages. I see it somewhere else instead of on the page. This just happen this morning before I came over here.

## 2017-10-13 NOTE — ED Provider Notes (Signed)
[facility] EMERGENCY DEPARTMENT Provider Note   CSN: [id number] Arrival date & time: 10/13/17  2952     History   Chief Complaint Chief Complaint  Patient presents with  . Head Injury  . Visual Field Change    HPI Catherine Sullivan is a 84 y.o. female. Level 5 caveat due to dementia. HPI Patient presents after fall.  Larey Seat a couple days ago hitting her head.  This morning was a little bit confused.  States today when she was trying to read she sought to move everything.  Also states though if she closed one eye she was still see 2.  States it is back to normal now.  Does have history of dementia. Past Medical History:  Diagnosis Date  . High cholesterol   . Hypertension   . Pacemaker     Patient Active Problem List   Diagnosis Date Noted  . Hypokalemia 04/13/2014  . Mitral regurgitation 04/13/2014  . Dyslipidemia 04/13/2014  . Cardiac pacemaker in situ 04/13/2014  . HTN (hypertension) 04/13/2014  . GERD (gastroesophageal reflux disease) 04/13/2014  . Osteopenia 04/13/2014  . Post herpetic neuralgia 04/13/2014  . Hyperthyroidism 04/12/2014  . TIA (transient ischemic attack) 11/25/2013  . Hypertension 11/25/2013    History reviewed. No pertinent surgical history.   OB History   None      Home Medications    Prior to Admission medications   Medication Sig Start Date End Date Taking? Authorizing Provider  aspirin 325 MG tablet Take 1 tablet (325 mg total) by mouth daily. 11/26/13   Esperanza Sheets, MD  atenolol (TENORMIN) 25 MG tablet Take 25 mg by mouth 2 (two) times daily.  09/09/13   [provider]  cholecalciferol (VITAMIN D) 1000 UNITS tablet Take 1,000 Units by mouth daily.    [provider]  clonazePAM (KLONOPIN) 0.5 MG tablet Take 0.5 mg by mouth 2 (two) times daily as needed for anxiety.    [provider]  DULoxetine (CYMBALTA) 30 MG capsule Take 30 mg by mouth daily.    [provider]  escitalopram  (LEXAPRO) 10 MG tablet Take 10 mg by mouth daily.    [provider]  l-methylfolate-B6-B12 (METANX) 3-35-2 MG TABS Take 1 tablet by mouth daily.    [provider]  losartan (COZAAR) 25 MG tablet Take 25 mg by mouth daily.    [provider]  potassium chloride SA (K-DUR,KLOR-CON) 20 MEQ tablet Take 20 mEq by mouth 2 (two) times daily.  09/09/13   [provider]  simvastatin (ZOCOR) 40 MG tablet Take 40 mg by mouth daily at 6 PM.  09/09/13   [provider]    Family History Family History  Problem Relation Age of Onset  . Thyroid disease Sister   . Thyroid disease Son     Social History Social History   Tobacco Use  . Smoking status: Never Smoker  . Smokeless tobacco: Never Used  Substance Use Topics  . Alcohol use: No  . Drug use: Not on file     Allergies   Hydrocodone; Lyrica [pregabalin]; and Oxycodone   Review of Systems Review of Systems  Unable to perform ROS: Dementia     Physical Exam Updated Vital Signs BP (!) 162/70   Pulse 66   Temp 98.4 F (36.9 C) (Oral)   Resp 15   Ht 5\' 2"  (1.575 m)   Wt 59.9 kg (132 lb)   SpO2 96%   BMI 24.14 kg/m  Physical Exam  Constitutional: She appears well-developed.  HENT:  Head: Normocephalic.  Small healing laceration to right occipital area.  Neck: Neck supple.  Cardiovascular: Normal rate.  Pulmonary/Chest: Effort normal.  Musculoskeletal: She exhibits no tenderness.  Neurological: She is alert.  Awake and pleasant, with some dementia.  Skin: Skin is warm.     ED Treatments / Results  Labs (all labs ordered are listed, but only abnormal results are displayed) Labs Reviewed  BASIC METABOLIC PANEL - Abnormal; Notable for the following components:      Result Value   Glucose, Bld 101 (*)    Creatinine, Ser 1.01 (*)    GFR calc non Af Amer 49 (*)    GFR calc Af Amer 57 (*)    All other components within normal limits  URINALYSIS, ROUTINE W REFLEX MICROSCOPIC  - Abnormal; Notable for the following components:   APPearance CLOUDY (*)    Hgb urine dipstick SMALL (*)    Leukocytes, UA LARGE (*)    All other components within normal limits  URINALYSIS, MICROSCOPIC (REFLEX) - Abnormal; Notable for the following components:   Bacteria, UA FEW (*)    All other components within normal limits  URINE CULTURE  CBC WITH DIFFERENTIAL/PLATELET    EKG None  Radiology Ct Angio Head W Or Wo Contrast  Result Date: 10/13/2017 CLINICAL DATA:  84 year old female status post fall out of bed 2 nights ago with head injury. New onset visual abnormality this morning. Underlying dementia. EXAM: CT ANGIOGRAPHY HEAD AND NECK TECHNIQUE: Multidetector CT imaging of the head and neck was performed using the standard protocol during bolus administration of intravenous contrast. Multiplanar CT image reconstructions and MIPs were obtained to evaluate the vascular anatomy. Carotid stenosis measurements (when applicable) are obtained utilizing NASCET criteria, using the distal internal carotid diameter as the denominator. CONTRAST:  50mL ISOVUE-370 IOPAMIDOL (ISOVUE-370) INJECTION 76% COMPARISON:  CTA head and neck 11/25/2013. FINDINGS: CT HEAD Brain: Cerebral volume is stable since 2015 and normal for age. No midline shift, ventriculomegaly, mass effect, evidence of mass lesion, intracranial hemorrhage or evidence of cortically based acute infarction. Gray-white matter differentiation is within normal limits throughout the brain. No cortical encephalomalacia identified. Calvarium and skull base: Negative. Paranasal sinuses: Visualized paranasal sinuses and mastoids are stable and well pneumatized. Orbits: Negative orbits soft tissues with stable postoperative changes to both globes. There is a mild right posterior convexity broad-based scalp hematoma or contusion on series 15, image 39. The underlying calvarium is intact. CTA NECK Skeleton: Absent dentition. Mild for age cervical spine  degeneration. No acute osseous abnormality identified. Upper chest: Left chest pacemaker. Stable and negative visible upper lungs. No superior mediastinal lymphadenopathy. Other neck: Stable and negative neck soft tissues. Aortic arch: Borderline bovine arch configuration with mild for age calcified arch atherosclerosis. Right carotid system: No brachiocephalic or right CCA origin stenosis, minimal plaque. Tortuous proximal right CCA. Mild for age calcified plaque at the right carotid bifurcation. Tortuous cervical right ICA without stenosis. Left carotid system: No left CCA origin stenosis. Mild calcified plaque at the left carotid bifurcation. Tortuous cervical left ICA without stenosis. Vertebral arteries: Chronic confluent calcified plaque along the undersurface of the right subclavian artery origin. No significant stenosis. Normal right vertebral artery origin. Tortuous right V1 and V2 segments. No right vertebral artery plaque or stenosis to the skull base. Chronic calcified plaque in the proximal left subclavian artery with no significant stenosis. Normal left vertebral artery origin. Tortuous left V1 segment. Mildly dominant and  ectatic left vertebral artery is patent to the skull base without stenosis. CTA HEAD Posterior circulation: Mildly dominant distal left vertebral artery. No distal vertebral stenosis, minimal plaque. Normal PICA origins and vertebrobasilar junction. Patent basilar artery without stenosis. Normal SCA and PCA origins (shared on the left, normal variant). Posterior communicating arteries are diminutive or absent. There is mild chronic right PCA P2 segment irregularity. Right PCA branches remain within normal limits. There is progressed and now multifocal left PCA severe irregularity and stenosis best demonstrated on series 10, image 21. The distal left PCA branches remain patent. Anterior circulation: There is extensive calcified atherosclerosis of both ICA siphons. Only mild bilateral  siphon stenosis results in the cavernous segments, greater on the right. Normal carotid termini. Normal MCA and ACA origins. Bilateral ACA branches are stable and within normal limits. Right MCA M1 segment and right MCA bifurcation are patent without stenosis. There is new multifocal moderate to severe right M2 branch irregularity since 2015, series 12, image 14. Chronic left MCA bifurcation irregularity was demonstrated in 2015, but there is new moderate to severe irregularity and stenosis in the mid M1 segment (series 10, image 21 and series 11 image 21. The left MCA M1 segment and bifurcation remain patent. There is chronic left bifurcation irregularity and stenosis. There is new multifocal moderate to severe left M2 stenosis, including in the distal aspect of the dominant posterior division (series 12, image 28). No MCA branch occlusion identified. Venous sinuses: Patent on the delayed images. Anatomic variants: Mildly dominant left vertebral artery. Delayed phase: No abnormal enhancement identified. Review of the MIP images confirms the above findings IMPRESSION: 1. No large vessel occlusion or acute cortically based infarct identified. 2. Positive for progressed multifocal moderate and severe intracranial arterial stenoses since the 2015 CTA, mostly affecting medium-sized arteries. The Left MCA and PCA are most heavily affected, including the Left M1 segment (see series 10, image 21). 3. Mild for age atherosclerosis elsewhere with no significant arterial stenosis in the neck or at the skull base. 4. CT appearance of the brain is stable since 2015 and negative for age. 5. Mild right posterior scalp soft tissue injury without underlying skull fracture. Electronically Signed   By: Odessa Fleming M.D.   On: 10/13/2017 13:34   Ct Angio Neck W And/or Wo Contrast  Result Date: 10/13/2017 CLINICAL DATA:  84 year old female status post fall out of bed 2 nights ago with head injury. New onset visual abnormality this  morning. Underlying dementia. EXAM: CT ANGIOGRAPHY HEAD AND NECK TECHNIQUE: Multidetector CT imaging of the head and neck was performed using the standard protocol during bolus administration of intravenous contrast. Multiplanar CT image reconstructions and MIPs were obtained to evaluate the vascular anatomy. Carotid stenosis measurements (when applicable) are obtained utilizing NASCET criteria, using the distal internal carotid diameter as the denominator. CONTRAST:  50mL ISOVUE-370 IOPAMIDOL (ISOVUE-370) INJECTION 76% COMPARISON:  CTA head and neck 11/25/2013. FINDINGS: CT HEAD Brain: Cerebral volume is stable since 2015 and normal for age. No midline shift, ventriculomegaly, mass effect, evidence of mass lesion, intracranial hemorrhage or evidence of cortically based acute infarction. Gray-white matter differentiation is within normal limits throughout the brain. No cortical encephalomalacia identified. Calvarium and skull base: Negative. Paranasal sinuses: Visualized paranasal sinuses and mastoids are stable and well pneumatized. Orbits: Negative orbits soft tissues with stable postoperative changes to both globes. There is a mild right posterior convexity broad-based scalp hematoma or contusion on series 15, image 39. The underlying calvarium is intact. CTA  NECK Skeleton: Absent dentition. Mild for age cervical spine degeneration. No acute osseous abnormality identified. Upper chest: Left chest pacemaker. Stable and negative visible upper lungs. No superior mediastinal lymphadenopathy. Other neck: Stable and negative neck soft tissues. Aortic arch: Borderline bovine arch configuration with mild for age calcified arch atherosclerosis. Right carotid system: No brachiocephalic or right CCA origin stenosis, minimal plaque. Tortuous proximal right CCA. Mild for age calcified plaque at the right carotid bifurcation. Tortuous cervical right ICA without stenosis. Left carotid system: No left CCA origin stenosis. Mild  calcified plaque at the left carotid bifurcation. Tortuous cervical left ICA without stenosis. Vertebral arteries: Chronic confluent calcified plaque along the undersurface of the right subclavian artery origin. No significant stenosis. Normal right vertebral artery origin. Tortuous right V1 and V2 segments. No right vertebral artery plaque or stenosis to the skull base. Chronic calcified plaque in the proximal left subclavian artery with no significant stenosis. Normal left vertebral artery origin. Tortuous left V1 segment. Mildly dominant and ectatic left vertebral artery is patent to the skull base without stenosis. CTA HEAD Posterior circulation: Mildly dominant distal left vertebral artery. No distal vertebral stenosis, minimal plaque. Normal PICA origins and vertebrobasilar junction. Patent basilar artery without stenosis. Normal SCA and PCA origins (shared on the left, normal variant). Posterior communicating arteries are diminutive or absent. There is mild chronic right PCA P2 segment irregularity. Right PCA branches remain within normal limits. There is progressed and now multifocal left PCA severe irregularity and stenosis best demonstrated on series 10, image 21. The distal left PCA branches remain patent. Anterior circulation: There is extensive calcified atherosclerosis of both ICA siphons. Only mild bilateral siphon stenosis results in the cavernous segments, greater on the right. Normal carotid termini. Normal MCA and ACA origins. Bilateral ACA branches are stable and within normal limits. Right MCA M1 segment and right MCA bifurcation are patent without stenosis. There is new multifocal moderate to severe right M2 branch irregularity since 2015, series 12, image 14. Chronic left MCA bifurcation irregularity was demonstrated in 2015, but there is new moderate to severe irregularity and stenosis in the mid M1 segment (series 10, image 21 and series 11 image 21. The left MCA M1 segment and bifurcation  remain patent. There is chronic left bifurcation irregularity and stenosis. There is new multifocal moderate to severe left M2 stenosis, including in the distal aspect of the dominant posterior division (series 12, image 28). No MCA branch occlusion identified. Venous sinuses: Patent on the delayed images. Anatomic variants: Mildly dominant left vertebral artery. Delayed phase: No abnormal enhancement identified. Review of the MIP images confirms the above findings IMPRESSION: 1. No large vessel occlusion or acute cortically based infarct identified. 2. Positive for progressed multifocal moderate and severe intracranial arterial stenoses since the 2015 CTA, mostly affecting medium-sized arteries. The Left MCA and PCA are most heavily affected, including the Left M1 segment (see series 10, image 21). 3. Mild for age atherosclerosis elsewhere with no significant arterial stenosis in the neck or at the skull base. 4. CT appearance of the brain is stable since 2015 and negative for age. 5. Mild right posterior scalp soft tissue injury without underlying skull fracture. Electronically Signed   By: Odessa FlemingH  Hall M.D.   On: 10/13/2017 13:34    Procedures Procedures (including critical care time)  Medications Ordered in ED Medications  iopamidol (ISOVUE-370) 76 % injection 50 mL (has no administration in time range)  sodium chloride 0.9 % bolus 500 mL (0 mLs Intravenous Stopped  10/13/17 1236)  iopamidol (ISOVUE-370) 76 % injection 50 mL (50 mLs Intravenous Contrast Given 10/13/17 1253)     Initial Impression / Assessment and Plan / ED Course  I have reviewed the triage vital signs and the nursing notes.  Pertinent labs & imaging results that were available during my care of the patient were reviewed by me and considered in my medical decision making (see chart for details).     Patient with fall.  Traumatic work-up is benign.  Head CT and CTA of the head and neck done.  Reassuring overall.  Discussed with Dr.  Amada Jupiter.  Has some acute on chronic disease but nothing acutely ischemic.  It is reassuring that the vision did not change closing one eye. questionalble UTI. Culture sent. D/c home. Discussed with patients sons.  Final Clinical Impressions(s) / ED Diagnoses   Final diagnoses:  Fall, initial encounter  Concussion without loss of consciousness, initial encounter  Dehydration    ED Discharge Orders    None       Benjiman Core, MD 10/13/17 1609

## 2017-10-13 NOTE — Discharge Instructions (Signed)
Urine culture is been sent.  Follow-up with  your primary care doctor.  You will be contacted if it shows an infection.

## 2017-10-13 NOTE — ED Notes (Signed)
Michaela CornerDaniel Marchitto ( son) [phone] ; Per Son , pt has some mild to moderate dementia and patient will get upset if it is mentioned in front of the patient

## 2017-10-13 NOTE — ED Notes (Signed)
Pt ambulated to restroom. Will collect urine culture when pt is done urinating.

## 2017-10-14 LAB — URINE CULTURE: CULTURE: NO GROWTH

## 2017-10-20 NOTE — ED Notes (Signed)
Pt.s son called and requested a call back for results. Called the number left, ,message left on voicemail.  10/20/2017. 14:29

## 2017-10-23 NOTE — ED Notes (Signed)
10/23/2017, Pt.s son called and left a message on this RN's voicemail for results of urine culture.  Called  Number back and message left for a return call. 8:40 am

## 2018-05-25 ENCOUNTER — Other Ambulatory Visit: Payer: Self-pay

## 2018-05-25 ENCOUNTER — Encounter (HOSPITAL_COMMUNITY): Payer: Self-pay

## 2018-05-25 ENCOUNTER — Emergency Department (HOSPITAL_COMMUNITY)
Admission: EM | Admit: 2018-05-25 | Discharge: 2018-05-26 | Disposition: A | Discharge status: Home / Self Care | Payer: Medicare Other | Attending: Emergency Medicine | Admitting: Emergency Medicine

## 2018-05-25 DIAGNOSIS — F0390 Unspecified dementia without behavioral disturbance: Secondary | ICD-10-CM | POA: Insufficient documentation

## 2018-05-25 DIAGNOSIS — Z5321 Procedure and treatment not carried out due to patient leaving prior to being seen by health care provider: Secondary | ICD-10-CM | POA: Diagnosis not present

## 2018-05-25 DIAGNOSIS — R4182 Altered mental status, unspecified: Secondary | ICD-10-CM | POA: Diagnosis present

## 2018-05-25 DIAGNOSIS — J189 Pneumonia, unspecified organism: Secondary | ICD-10-CM | POA: Diagnosis not present

## 2018-05-25 LAB — CBC
HCT: 38.6 % (ref 36.0–46.0)
Hemoglobin: 13 g/dL (ref 12.0–15.0)
MCH: 30.7 pg (ref 26.0–34.0)
MCHC: 33.7 g/dL (ref 30.0–36.0)
MCV: 91.3 fL (ref 80.0–100.0)
Platelets: 230 10*3/uL (ref 150–400)
RBC: 4.23 MIL/uL (ref 3.87–5.11)
RDW: 12.6 % (ref 11.5–15.5)
WBC: 7.3 10*3/uL (ref 4.0–10.5)
nRBC: 0 % (ref 0.0–0.2)

## 2018-05-25 LAB — COMPREHENSIVE METABOLIC PANEL
ALT: 22 U/L (ref 0–44)
AST: 26 U/L (ref 15–41)
Albumin: 4.2 g/dL (ref 3.5–5.0)
Alkaline Phosphatase: 74 U/L (ref 38–126)
Anion gap: 12 (ref 5–15)
BUN: 26 mg/dL — ABNORMAL HIGH (ref 8–23)
CO2: 25 mmol/L (ref 22–32)
Calcium: 9.8 mg/dL (ref 8.9–10.3)
Chloride: 99 mmol/L (ref 98–111)
Creatinine, Ser: 0.95 mg/dL (ref 0.44–1.00)
GFR calc Af Amer: >60 mL/min (ref >60)
GFR calc non Af Amer: 54 mL/min — ABNORMAL LOW (ref >60)
Glucose, Bld: 128 mg/dL — ABNORMAL HIGH (ref 70–99)
Potassium: 3.9 mmol/L (ref 3.5–5.1)
Sodium: 136 mmol/L (ref 135–145)
Total Bilirubin: 1 mg/dL (ref 0.3–1.2)
Total Protein: 6.9 g/dL (ref 6.5–8.1)

## 2018-05-25 MED ORDER — SODIUM CHLORIDE 0.9% FLUSH: 3.0000 mL | Freq: Once | INTRAVENOUS | Status: DC

## 2018-05-25 NOTE — ED Triage Notes (Signed)
Pt here with having increased confusion that began 45 min PTA.  No neuro deficits.  Hx of dementia and recently diagnosed with PNA, completed abx treatments for that.

## 2018-05-26 NOTE — ED Notes (Signed)
No answer for vitals recheck

## 2018-11-18 ENCOUNTER — Encounter: Payer: Self-pay | Admitting: Internal Medicine

## 2018-11-18 ENCOUNTER — Ambulatory Visit
Admission: RE | Admit: 2018-11-18 | Discharge: 2018-11-18 | Disposition: A | Discharge status: Home / Self Care | Payer: Medicare Other | Source: Ambulatory Visit | Attending: Internal Medicine | Admitting: Internal Medicine

## 2018-11-18 ENCOUNTER — Ambulatory Visit (INDEPENDENT_AMBULATORY_CARE_PROVIDER_SITE_OTHER): Payer: Medicare Other | Admitting: Internal Medicine

## 2018-11-18 ENCOUNTER — Other Ambulatory Visit: Payer: Self-pay

## 2018-11-18 VITALS — BP 124/60 | HR 83 | Ht 62.0 in | Wt 141.8 lb

## 2018-11-18 DIAGNOSIS — R059 Cough, unspecified: Secondary | ICD-10-CM

## 2018-11-18 DIAGNOSIS — E0590 Thyrotoxicosis, unspecified without thyrotoxic crisis or storm: Secondary | ICD-10-CM

## 2018-11-18 DIAGNOSIS — R0602 Shortness of breath: Secondary | ICD-10-CM | POA: Diagnosis not present

## 2018-11-18 DIAGNOSIS — R062 Wheezing: Secondary | ICD-10-CM | POA: Diagnosis not present

## 2018-11-18 DIAGNOSIS — R05 Cough: Secondary | ICD-10-CM

## 2018-11-18 DIAGNOSIS — R0781 Pleurodynia: Secondary | ICD-10-CM | POA: Diagnosis not present

## 2018-11-18 DIAGNOSIS — I10 Essential (primary) hypertension: Secondary | ICD-10-CM

## 2018-11-18 NOTE — Progress Notes (Signed)
Cardiology Office Note   Date:  11/18/2018   ID:  Catherine Sullivan, DOB 08-Jul-1928, MRN [id number]  PCP:  System, Pcp Not In  Cardiologist:  Primary cardiologist in [place], [place]   Patient presents for evaluation of fatigue, dyspnea     History of Present Illness: Catherine Sullivan is a 85 y.o. female with a history of HTN, HL,hyperthyroidism, TIA (2015), CV dz and dementia.   She is visity son, normally followed in [place], [place]    The pt's son called in today   Catherine Sullivan his mother is staying at his house visiting  About 4 months ago she  was treated [facility] a pneumonia as an outpt (ABX and nebulizer) Five days ago she was sitting outside while lawn was being mowed.   Son believes pollen triggered allergies  Says since then his mom has been feeling worse   Eating and drinking OK   But low energy, restlessess.   Coughing with clear sputum     Today, son went to help her up from chair   Hugged her and pt screamed because chest hurt   Chest pain is worse with breathing    IN Feb she was treated for a UTI as well as pneumonia  Had pacer checked this year    WOrking properly by son's report      No exposures Warm at times   No chills       Current Meds  Medication Sig  . amLODipine (NORVASC) 2.5 MG tablet Take 5 mg by mouth daily.   Marland Kitchen aspirin 325 MG tablet Take 1 tablet (325 mg total) by mouth daily.  Marland Kitchen atenolol (TENORMIN) 25 MG tablet Take 25 mg by mouth 2 (two) times daily.   . cephALEXin (KEFLEX) 500 MG capsule daily.   . cholecalciferol (VITAMIN D) 1000 UNITS tablet Take 1,000 Units by mouth daily.  . DULoxetine (CYMBALTA) 30 MG capsule Take 30 mg by mouth daily.  Marland Kitchen l-methylfolate-B6-B12 (METANX) 3-35-2 MG TABS Take 1 tablet by mouth daily.  Marland Kitchen losartan (COZAAR) 50 MG tablet Take 50 mg by mouth 2 (two) times daily.   . metoprolol tartrate (LOPRESSOR) 25 MG tablet Take 25 mg by mouth 2 (two) times daily.   . Omega-3 Fatty Acids (FISH OIL) 1000 MG CAPS Take by mouth.  . potassium  chloride SA (K-DUR,KLOR-CON) 20 MEQ tablet Take 20 mEq by mouth 2 (two) times daily.   . simvastatin (ZOCOR) 40 MG tablet Take 40 mg by mouth daily at 6 PM.      Allergies:   Hydrocodone, Lyrica [pregabalin], and Oxycodone   Past Medical History:  Diagnosis Date  . High cholesterol   . Hypertension   . Pacemaker     No past surgical history on file.   Social History:  The patient  reports that she has never smoked. She has never used smokeless tobacco. She reports that she does not drink alcohol.   Family History:  The patient's family history includes Thyroid disease in her sister and son.    ROS:  Please see the history of present illness. All other systems are reviewed and  Negative to the above problem except as noted.    PHYSICAL EXAM: VS:  BP 124/60   Pulse 83   Ht 5\' 2"  (1.575 m)   Wt 141 lb 12.8 oz (64.3 kg)   SpO2 98%   BMI 25.94 kg/m   GEN: Well nourished, well developed, in no acute distress  HEENT: normal  Neck: no  JVD  No thyromegaly  No masses Cardiac: RRR; no murmurs, rubs, or gallops,no edema  Respiratory:  Some decreased airflow   Mild wheezes bilaterally Chest   Tender bilateral lower ribs   GI: soft, nontender, nondistended, + BS  No hepatomegaly  MS: no deformity Moving all extremities   Skin: warm and dry, no rash Neuro:  Strength and sensation are intact Psych: euthymic mood, full affect   EKG:  EKG is not ordered today.   Lipid Panel    Component Value Date/Time   CHOL 182 11/26/2013 0559   TRIG 99 11/26/2013 0559   HDL 105 11/26/2013 0559   CHOLHDL 1.7 11/26/2013 0559   VLDL 20 11/26/2013 0559   LDLCALC 57 11/26/2013 0559      Wt Readings from Last 3 Encounters:  11/18/18 141 lb 12.8 oz (64.3 kg)  10/13/17 132 lb (59.9 kg)  02/08/15 135 lb (61.2 kg)     Head CT July 2019  COMPARISON:  CTA head and neck 11/25/2013.  FINDINGS: CT HEAD  Brain: Cerebral volume is stable since 2015 and normal for age. No midline shift,  ventriculomegaly, mass effect, evidence of mass lesion, intracranial hemorrhage or evidence of cortically based acute infarction. Gray-white matter differentiation is within normal limits throughout the brain. No cortical encephalomalacia identified.  Calvarium and skull base: Negative.  Paranasal sinuses: Visualized paranasal sinuses and mastoids are stable and well pneumatized.  Orbits: Negative orbits soft tissues with stable postoperative changes to both globes. There is a mild right posterior convexity broad-based scalp hematoma or contusion on series 15, image 39. The underlying calvarium is intact.  CTA NECK  Skeleton: Absent dentition. Mild for age cervical spine degeneration. No acute osseous abnormality identified.  Upper chest: Left chest pacemaker. Stable and negative visible upper lungs. No superior mediastinal lymphadenopathy.  Other neck: Stable and negative neck soft tissues.  Aortic arch: Borderline bovine arch configuration with mild for age calcified arch atherosclerosis.  Right carotid system: No brachiocephalic or right CCA origin stenosis, minimal plaque. Tortuous proximal right CCA. Mild for age calcified plaque at the right carotid bifurcation. Tortuous cervical right ICA without stenosis.  Left carotid system: No left CCA origin stenosis. Mild calcified plaque at the left carotid bifurcation. Tortuous cervical left ICA without stenosis.  Vertebral arteries: Chronic confluent calcified plaque along the undersurface of the right subclavian artery origin. No significant stenosis. Normal right vertebral artery origin. Tortuous right V1 and V2 segments. No right vertebral artery plaque or stenosis to the skull base.  Chronic calcified plaque in the proximal left subclavian artery with no significant stenosis. Normal left vertebral artery origin. Tortuous left V1 segment. Mildly dominant and ectatic left vertebral artery is patent to the  skull base without stenosis.  CTA HEAD  Posterior circulation: Mildly dominant distal left vertebral artery. No distal vertebral stenosis, minimal plaque. Normal PICA origins and vertebrobasilar junction. Patent basilar artery without stenosis. Normal SCA and PCA origins (shared on the left, normal variant). Posterior communicating arteries are diminutive or absent.  There is mild chronic right PCA P2 segment irregularity. Right PCA branches remain within normal limits. There is progressed and now multifocal left PCA severe irregularity and stenosis best demonstrated on series 10, image 21. The distal left PCA branches remain patent.  Anterior circulation: There is extensive calcified atherosclerosis of both ICA siphons. Only mild bilateral siphon stenosis results in the cavernous segments, greater on the right. Normal carotid termini. Normal MCA and ACA origins. Bilateral ACA branches are stable and  within normal limits. Right MCA M1 segment and right MCA bifurcation are patent without stenosis. There is new multifocal moderate to severe right M2 branch irregularity since 2015, series 12, image 14.  Chronic left MCA bifurcation irregularity was demonstrated in 2015, but there is new moderate to severe irregularity and stenosis in the mid M1 segment (series 10, image 21 and series 11 image 21. The left MCA M1 segment and bifurcation remain patent. There is chronic left bifurcation irregularity and stenosis. There is new multifocal moderate to severe left M2 stenosis, including in the distal aspect of the dominant posterior division (series 12, image 28).  No MCA branch occlusion identified.  Venous sinuses: Patent on the delayed images.  Anatomic variants: Mildly dominant left vertebral artery.  Delayed phase: No abnormal enhancement identified.  Review of the MIP images confirms the above findings  IMPRESSION: 1. No large vessel occlusion or acute cortically  based infarct identified. 2. Positive for progressed multifocal moderate and severe intracranial arterial stenoses since the 2015 CTA, mostly affecting medium-sized arteries. The Left MCA and PCA are most heavily affected, including the Left M1 segment (see series 10, image 21). 3. Mild for age atherosclerosis elsewhere with no significant arterial stenosis in the neck or at the skull base. 4. CT appearance of the brain is stable since 2015 and negative for age. 5. Mild right posterior scalp soft tissue injury without underlying skull fracture.   Electronically Signed   By: Odessa FlemingH  Hall M.D.  ASSESSMENT AND PLAN:  1  Dypsnea/ cough   Pt moving air in all lung regions   SOme decreased airflow with wheezes   Volume appears to be OK on exam Most likely reactive airway dz   I am not convinced of active infection But would recomm Pa/lateral CXR as well as CBC, BMET and BNP Rx given for albuterol and flovent with spacer      2Chest pain    Probably from coughing   Does not appear to be cardiac Will review EKG  3  HTN   BP is OK   Check BMET  4   Hx of Thyroid dysfunction  Will check TSH  5  Hx PPM    REcent f/u by primary cardiologist  7  Hx CV dz   Pt is on statin    Pt has cardiologist near her home  6  ? UTI    WIth hx and not felling well wll get UA    NO definite follow up as [place] will be returning to [place] area.   Only if lab eval abnormal    Current medicines are reviewed at length with the patient today.  The patient does not have concerns regarding medicines.  Signed, Dietrich PatesPaula Bernis Stecher, MD  11/18/2018 4:00 PM    [facility] [address], [place], [place]  [address] Phone: [phone]; Fax: [phone]

## 2018-11-18 NOTE — Patient Instructions (Addendum)
Medication Instructions:   AS INSTRUCTED BY DR. Harrington Challenger  If you need a refill on your cardiac medications before your next appointment, please call your pharmacy.     Lab work:  TODAY-BMET, TSH,  PRO-BNP, CBC W DIFF, AND URINALYSIS  If you have labs (blood work) drawn today and your tests are completely normal, you will receive your results only by: Marland Kitchen MyChart Message (if you have MyChart) OR . A paper copy in the mail If you have any lab test that is abnormal or we need to change your treatment, we will call you to review the results.    TESTING:  CHEST X-RAY TODAY--PLEASE GO TO [facility]    Follow-Up:  AS NEEDED AND BASED ON YOUR TEST RESULTS PER DR ROSS

## 2018-11-19 LAB — URINALYSIS
Bilirubin, UA: NEGATIVE
Glucose, UA: NEGATIVE
Ketones, UA: NEGATIVE
Nitrite, UA: NEGATIVE
RBC, UA: NEGATIVE
Specific Gravity, UA: 1.019 (ref 1.005–1.030)
Urobilinogen, Ur: 0.2 mg/dL (ref 0.2–1.0)
pH, UA: 5 (ref 5.0–7.5)

## 2018-11-19 LAB — BASIC METABOLIC PANEL
BUN/Creatinine Ratio: 21 (ref 12–28)
BUN: 31 mg/dL — ABNORMAL HIGH (ref 8–27)
CO2: 25 mmol/L (ref 20–29)
Calcium: 9.7 mg/dL (ref 8.7–10.3)
Chloride: 102 mmol/L (ref 96–106)
Creatinine, Ser: 1.45 mg/dL — ABNORMAL HIGH (ref 0.57–1.00)
GFR calc Af Amer: 37 mL/min/{1.73_m2} — ABNORMAL LOW (ref >59)
GFR calc non Af Amer: 32 mL/min/{1.73_m2} — ABNORMAL LOW (ref >59)
Glucose: 140 mg/dL — ABNORMAL HIGH (ref 65–99)
Potassium: 4.1 mmol/L (ref 3.5–5.2)
Sodium: 141 mmol/L (ref 134–144)

## 2018-11-19 LAB — CBC WITH DIFFERENTIAL/PLATELET
Basophils Absolute: 0.1 10*3/uL (ref 0.0–0.2)
Basos: 1 %
EOS (ABSOLUTE): 0.2 10*3/uL (ref 0.0–0.4)
Eos: 3 %
Hematocrit: 36.7 % (ref 34.0–46.6)
Hemoglobin: 12.2 g/dL (ref 11.1–15.9)
Immature Grans (Abs): 0 10*3/uL (ref 0.0–0.1)
Immature Granulocytes: 0 %
Lymphocytes Absolute: 2.2 10*3/uL (ref 0.7–3.1)
Lymphs: 30 %
MCH: 31.1 pg (ref 26.6–33.0)
MCHC: 33.2 g/dL (ref 31.5–35.7)
MCV: 94 fL (ref 79–97)
Monocytes Absolute: 0.6 10*3/uL (ref 0.1–0.9)
Monocytes: 9 %
Neutrophils Absolute: 4.2 10*3/uL (ref 1.4–7.0)
Neutrophils: 57 %
Platelets: 235 10*3/uL (ref 150–450)
RBC: 3.92 x10E6/uL (ref 3.77–5.28)
RDW: 13 % (ref 11.7–15.4)
WBC: 7.3 10*3/uL (ref 3.4–10.8)

## 2018-11-19 LAB — TSH: TSH: 0.996 u[IU]/mL (ref 0.450–4.500)

## 2018-11-19 LAB — PRO B NATRIURETIC PEPTIDE: NT-Pro BNP: 172 pg/mL (ref 0–738)

## 2019-04-08 DEATH — deceased

## 2019-04-29 IMAGING — CT CT ANGIO HEAD
1 of 12 series · 4 of 33 positions shown · IV contrast (APPLIED)
Comparison: CTA head and neck 11/25/2013.

CLINICAL DATA: 88-year-old female status post fall out of bed 2
nights ago with head injury. New onset visual abnormality this
morning. Underlying dementia.

EXAM:
CT ANGIOGRAPHY HEAD AND NECK
TECHNIQUE: Multidetector CT imaging of the head and neck was performed using
the standard protocol during bolus administration of intravenous
contrast. Multiplanar CT image reconstructions and MIPs were
obtained to evaluate the vascular anatomy. Carotid stenosis
measurements (when applicable) are obtained utilizing NASCET
criteria, using the distal internal carotid diameter as the
denominator.
CONTRAST:  50mL A5OB0N-0CE IOPAMIDOL (A5OB0N-0CE) INJECTION 76%

[Series 7: ax thins · axial · 0.39mm/px · z∈[-259,-67]mm · 4 of 309 slices shown]
[im 62/309  soft-tissue]
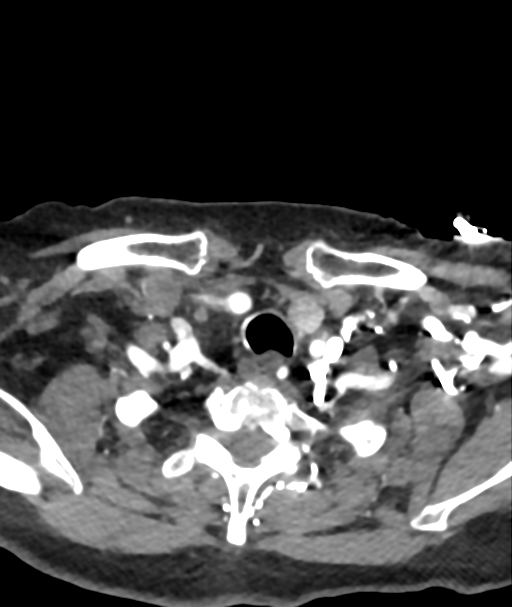
[im 124/309  bone]
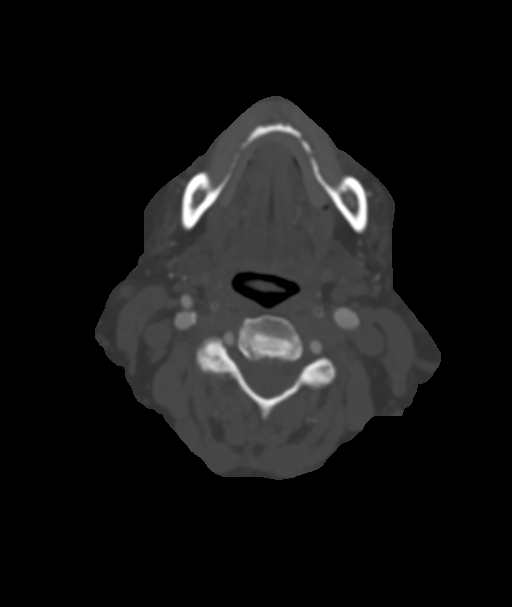
[im 185/309  soft-tissue]
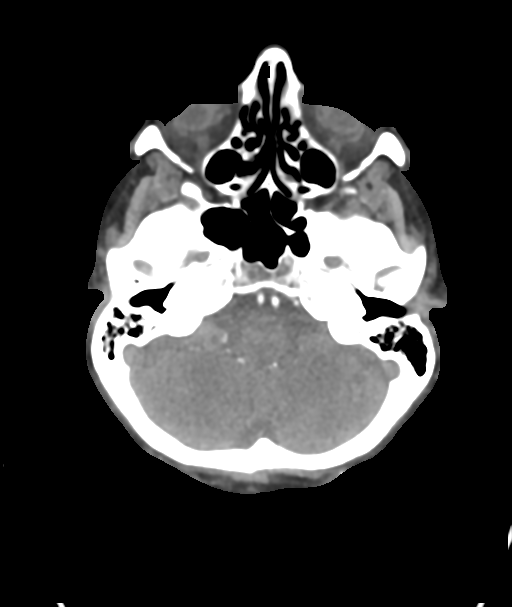
[im 247/309  bone]
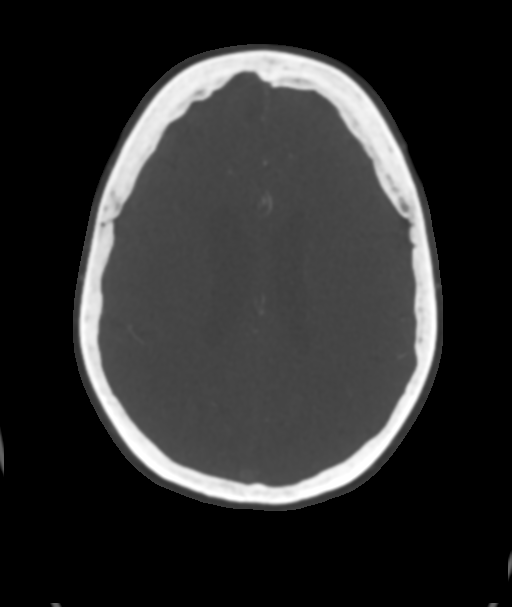

[4 of 33 positions shown; findings below may reference images not displayed]

FINDINGS: CT HEAD

Brain: Cerebral volume is stable since 6811 and normal for age. No
midline shift, ventriculomegaly, mass effect, evidence of mass
lesion, intracranial hemorrhage or evidence of cortically based
acute infarction. Gray-white matter differentiation is within normal
limits throughout the brain. No cortical encephalomalacia
identified.

Calvarium and skull base: Negative.

Paranasal sinuses: Visualized paranasal sinuses and mastoids are
stable and well pneumatized.

Orbits: Negative orbits soft tissues with stable postoperative
changes to both globes. There is a mild right posterior convexity
broad-based scalp hematoma or contusion on series 15, image 39. The
underlying calvarium is intact.

CTA NECK

Skeleton: Absent dentition. Mild for age cervical spine
degeneration. No acute osseous abnormality identified.

Upper chest: Left chest pacemaker. Stable and negative visible upper
lungs. No superior mediastinal lymphadenopathy.

Other neck: Stable and negative neck soft tissues.

Aortic arch: Borderline bovine arch configuration with mild for age
calcified arch atherosclerosis.

Right carotid system: No brachiocephalic or right CCA origin
stenosis, minimal plaque. Tortuous proximal right CCA. Mild for age
calcified plaque at the right carotid bifurcation. Tortuous cervical
right ICA without stenosis.

Left carotid system: No left CCA origin stenosis. Mild calcified
plaque at the left carotid bifurcation. Tortuous cervical left ICA
without stenosis.

Vertebral arteries:
Chronic confluent calcified plaque along the undersurface of the
right subclavian artery origin. No significant stenosis. Normal
right vertebral artery origin. Tortuous right V1 and V2 segments. No
right vertebral artery plaque or stenosis to the skull base.

Chronic calcified plaque in the proximal left subclavian artery with
no significant stenosis. Normal left vertebral artery origin.
Tortuous left V1 segment. Mildly dominant and ectatic left vertebral
artery is patent to the skull base without stenosis.

CTA HEAD

Posterior circulation: Mildly dominant distal left vertebral artery.
No distal vertebral stenosis, minimal plaque. Normal PICA origins
and vertebrobasilar junction. Patent basilar artery without
stenosis. Normal SCA and PCA origins (shared on the left, normal
variant). Posterior communicating arteries are diminutive or absent.

There is mild chronic right PCA P2 segment irregularity. Right PCA
branches remain within normal limits. There is progressed and now
multifocal left PCA severe irregularity and stenosis best
demonstrated on series 10, image 21. The distal left PCA branches
remain patent.

Anterior circulation: There is extensive calcified atherosclerosis
of both ICA siphons. Only mild bilateral siphon stenosis results in
the cavernous segments, greater on the right. Normal carotid
termini. Normal MCA and ACA origins. Bilateral ACA branches are
stable and within normal limits. Right MCA M1 segment and right MCA
bifurcation are patent without stenosis. There is new multifocal
moderate to severe right M2 branch irregularity since 6811, series
12, image 14.

Chronic left MCA bifurcation irregularity was demonstrated in 6811,
but there is new moderate to severe irregularity and stenosis in the
mid M1 segment (series 10, image 21 and series 11 image 21. The left
MCA M1 segment and bifurcation remain patent. There is chronic left
bifurcation irregularity and stenosis. There is new multifocal
moderate to severe left M2 stenosis, including in the distal aspect
of the dominant posterior division (series 12, image 28).

No MCA branch occlusion identified.

Venous sinuses: Patent on the delayed images.

Anatomic variants: Mildly dominant left vertebral artery.

Delayed phase: No abnormal enhancement identified.

Review of the MIP images confirms the above findings
IMPRESSION: 1. No large vessel occlusion or acute cortically based infarct
identified.
2. Positive for progressed multifocal moderate and severe
intracranial arterial stenoses since the 6811 CTA, mostly affecting
medium-sized arteries.
The Left MCA and PCA are most heavily affected, including the Left
M1 segment (see series 10, image 21).
3. Mild for age atherosclerosis elsewhere with no significant
arterial stenosis in the neck or at the skull base.
4. CT appearance of the brain is stable since 6811 and negative for
age.
5. Mild right posterior scalp soft tissue injury without underlying
skull fracture.
